# Patient Record
Sex: Female | Born: 1985
Health system: Southern US, Community
[De-identification: ages and names within clinical notes are randomized; demographics above are authoritative.]

## PROBLEM LIST (undated history)

## (undated) DIAGNOSIS — F329 Major depressive disorder, single episode, unspecified: Secondary | ICD-10-CM

## (undated) DIAGNOSIS — G43909 Migraine, unspecified, not intractable, without status migrainosus: Secondary | ICD-10-CM

## (undated) DIAGNOSIS — N83209 Unspecified ovarian cyst, unspecified side: Secondary | ICD-10-CM

## (undated) DIAGNOSIS — F32A Depression, unspecified: Secondary | ICD-10-CM

## (undated) DIAGNOSIS — O00109 Unspecified tubal pregnancy without intrauterine pregnancy: Secondary | ICD-10-CM

## (undated) DIAGNOSIS — I10 Essential (primary) hypertension: Secondary | ICD-10-CM

## (undated) DIAGNOSIS — O009 Unspecified ectopic pregnancy without intrauterine pregnancy: Secondary | ICD-10-CM

## (undated) DIAGNOSIS — E282 Polycystic ovarian syndrome: Secondary | ICD-10-CM

## (undated) HISTORY — DX: Migraine, unspecified, not intractable, without status migrainosus: G43.909

## (undated) HISTORY — DX: Depression, unspecified: F32.A

## (undated) HISTORY — DX: Unspecified tubal pregnancy without intrauterine pregnancy: O00.109

## (undated) HISTORY — DX: Major depressive disorder, single episode, unspecified: F32.9

## (undated) HISTORY — PX: WISDOM TOOTH EXTRACTION: SHX21

---
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
## 2005-03-03 ENCOUNTER — Other Ambulatory Visit: Admission: RE | Admit: 2005-03-03 | Discharge: 2005-03-03 | Payer: Self-pay | Admitting: Obstetrics and Gynecology

## 2005-09-24 ENCOUNTER — Emergency Department (HOSPITAL_COMMUNITY): Admission: EM | Admit: 2005-09-24 | Discharge: 2005-09-24 | Payer: Self-pay | Admitting: Emergency Medicine

## 2005-10-08 ENCOUNTER — Emergency Department (HOSPITAL_COMMUNITY): Admission: EM | Admit: 2005-10-08 | Discharge: 2005-10-09 | Payer: Self-pay | Admitting: Emergency Medicine

## 2005-10-10 ENCOUNTER — Emergency Department (HOSPITAL_COMMUNITY): Admission: EM | Admit: 2005-10-10 | Discharge: 2005-10-10 | Payer: Self-pay | Admitting: Emergency Medicine

## 2006-02-13 ENCOUNTER — Emergency Department (HOSPITAL_COMMUNITY): Admission: EM | Admit: 2006-02-13 | Discharge: 2006-02-13 | Payer: Self-pay | Admitting: Emergency Medicine

## 2006-06-04 ENCOUNTER — Emergency Department (HOSPITAL_COMMUNITY): Admission: EM | Admit: 2006-06-04 | Discharge: 2006-06-05 | Payer: Self-pay | Admitting: Emergency Medicine

## 2006-06-05 ENCOUNTER — Emergency Department (HOSPITAL_COMMUNITY): Admission: EM | Admit: 2006-06-05 | Discharge: 2006-06-05 | Payer: Self-pay | Admitting: Emergency Medicine

## 2010-07-28 ENCOUNTER — Encounter: Payer: BC Managed Care – PPO | Attending: Certified Nurse Midwife | Admitting: Dietician

## 2010-07-28 DIAGNOSIS — T7840XA Allergy, unspecified, initial encounter: Secondary | ICD-10-CM | POA: Insufficient documentation

## 2010-07-28 DIAGNOSIS — E282 Polycystic ovarian syndrome: Secondary | ICD-10-CM | POA: Insufficient documentation

## 2010-07-28 DIAGNOSIS — Z713 Dietary counseling and surveillance: Secondary | ICD-10-CM | POA: Insufficient documentation

## 2010-07-28 DIAGNOSIS — N979 Female infertility, unspecified: Secondary | ICD-10-CM | POA: Insufficient documentation

## 2010-07-28 DIAGNOSIS — I10 Essential (primary) hypertension: Secondary | ICD-10-CM | POA: Insufficient documentation

## 2010-10-31 ENCOUNTER — Inpatient Hospital Stay (HOSPITAL_COMMUNITY)
Admission: AD | Admit: 2010-10-31 | Discharge: 2010-10-31 | Disposition: A | Discharge status: Home / Self Care | Payer: BC Managed Care – PPO | Source: Ambulatory Visit | Attending: Obstetrics & Gynecology | Admitting: Obstetrics & Gynecology

## 2010-10-31 ENCOUNTER — Other Ambulatory Visit: Payer: Self-pay

## 2010-10-31 ENCOUNTER — Encounter (HOSPITAL_COMMUNITY): Payer: Self-pay

## 2010-10-31 DIAGNOSIS — O009 Unspecified ectopic pregnancy without intrauterine pregnancy: Secondary | ICD-10-CM | POA: Insufficient documentation

## 2010-10-31 DIAGNOSIS — O00109 Unspecified tubal pregnancy without intrauterine pregnancy: Secondary | ICD-10-CM

## 2010-10-31 HISTORY — DX: Unspecified ovarian cyst, unspecified side: N83.209

## 2010-10-31 HISTORY — DX: Polycystic ovarian syndrome: E28.2

## 2010-10-31 HISTORY — DX: Unspecified tubal pregnancy without intrauterine pregnancy: O00.109

## 2010-10-31 LAB — TYPE AND SCREEN: Antibody Screen: NEGATIVE

## 2010-10-31 LAB — BUN: BUN: 10 mg/dL (ref 6–23)

## 2010-10-31 LAB — DIFFERENTIAL
Basophils Absolute: 0 10*3/uL (ref 0.0–0.1)
Basophils Relative: 0 % (ref 0–1)
Lymphocytes Relative: 25 % (ref 12–46)
Monocytes Absolute: 0.5 10*3/uL (ref 0.1–1.0)
Neutro Abs: 6.8 10*3/uL (ref 1.7–7.7)
Neutrophils Relative %: 69 % (ref 43–77)

## 2010-10-31 LAB — CBC
HCT: 35.3 % — ABNORMAL LOW (ref 36.0–46.0)
Hemoglobin: 12.1 g/dL (ref 12.0–15.0)
RDW: 13 % (ref 11.5–15.5)
WBC: 9.9 10*3/uL (ref 4.0–10.5)

## 2010-10-31 LAB — CREATININE, SERUM
Creatinine, Ser: 0.66 mg/dL (ref 0.50–1.10)
GFR calc non Af Amer: >60 mL/min (ref >60)

## 2010-10-31 LAB — HCG, QUANTITATIVE, PREGNANCY: hCG, Beta Chain, Quant, S: 2361 m[IU]/mL — ABNORMAL HIGH (ref <5)

## 2010-10-31 MED ORDER — METHOTREXATE INJECTION FOR WOMEN'S HOSPITAL
50.0000 mg/m2 | Freq: Once | INTRAMUSCULAR | Status: AC
  Administered 2010-10-31: 80 mg via INTRAMUSCULAR
  Filled 2010-10-31: qty 1.6

## 2010-10-31 NOTE — Progress Notes (Signed)
Sent from office, bleeding started 07/21.  Dx today with ectopic preg on left side.

## 2010-10-31 NOTE — ED Provider Notes (Signed)
History     Chief Complaint  Patient presents with  . Ectopic Pregnancy   HPI Comments: Patient seen in office today for vaginal spotting since Saturday, rpt quant done, slow rise over last few days (beta was 900+ on 7/27, today 1558).  LMP 09/16/2010, conceived on Femara. Sono in office today shows NO gestational sac in uterus, and nl ovaries. Left adnexal paratubal echogenic mass  measuring 1.0 x 1.0 x 0.9 cm with increased vascularity noted. Consistent with ectopic pregnancy given slow rise in beta and now spotting. Patient was counseled re medical vs surgical management at length. Given presentation at this time and desire to preserve fertility, best option for methotrexate advised and patient consented. Dr. Seymour Bars consulted and agrees.  Vaginal Bleeding She is pregnant.    OB History    Grav Para Term Preterm Abortions TAB SAB Ect Mult Living   1               Past Medical History  Diagnosis Date  . Ovarian cyst   . PCOS (polycystic ovarian syndrome)     Past Surgical History  Procedure Date  . No past surgeries     No family history on file.  History  Substance Use Topics  . Smoking status: Never Smoker   . Smokeless tobacco: Never Used  . Alcohol Use: No    Allergies: No Known Allergies  No prescriptions prior to admission    Review of Systems  Genitourinary: Positive for vaginal bleeding.  All other systems reviewed and are negative.  Denies abdominal pain, cramping, diaphoresis, dizziness, SOB Physical Exam   Labs: WBC  Date Value Range Status  10/31/2010 9.9  4.0-10.5 (K/uL) Final     Hemoglobin  Date Value Range Status  10/31/2010 12.1  12.0-15.0 (g/dL) Final     HCT  Date Value Range Status  10/31/2010 35.3* 36.0-46.0 (%) Final     Platelets  Date Value Range Status  10/31/2010 181  150-400 (K/uL) Final     AST  Date Value Range Status  10/31/2010 13  0-37 (U/L) Final     ABO/RH(D)  Date Value Range Status  10/31/2010 O POS   Final     Serum creatinine 0.66 ; BUN 10 BhCG 2361  Blood pressure 120/74, pulse 93, temperature 98.8 F (37.1 C), temperature source Oral, resp. rate 18, height 5' 1.5" (1.562 m), weight 59.512 kg (131 lb 3.2 oz), last menstrual period 09/16/2010.  Physical Exam  Vitals reviewed. Constitutional: She is oriented to person, place, and time. She appears well-developed and well-nourished.  HENT:  Head: Normocephalic.  Neck: Normal range of motion.  Cardiovascular: Normal rate and regular rhythm.   Respiratory: Effort normal and breath sounds normal.  GI: Soft. Bowel sounds are normal. There is no tenderness.  Genitourinary: Uterus normal.  Neurological: She is alert and oriented to person, place, and time.  Skin: Skin is warm and dry.  Psychiatric: She has a normal mood and affect.  Appropriately sad  MAU Course  Procedures  MDM n/a  Assessment and Plan  Ectopic pregnancy - L tubal - at 6 3/7 wks Stable hemodynamic status Normal renal and liver function tests RH positive status  Methotrexate single dose protocol Plan repeat HCG levels day 4 and day 7 in MAU (results STAT to CNM on call) F/U with Office visit in 2 weeks Ectopic precautions reviewed and H/O given  Dr. Seymour Bars consulted - agrees.  April Bowen,April Bowen 10/31/2010, 8:10 PM

## 2010-10-31 NOTE — ED Notes (Signed)
Ectopic packet given to pt.  Reviewed contents and plan.

## 2010-11-03 ENCOUNTER — Inpatient Hospital Stay (HOSPITAL_COMMUNITY)
Admission: AD | Admit: 2010-11-03 | Discharge: 2010-11-03 | Disposition: A | Discharge status: Home / Self Care | Payer: BC Managed Care – PPO | Source: Ambulatory Visit | Attending: Obstetrics | Admitting: Obstetrics

## 2010-11-03 DIAGNOSIS — Z0189 Encounter for other specified special examinations: Secondary | ICD-10-CM | POA: Insufficient documentation

## 2010-11-03 LAB — HCG, QUANTITATIVE, PREGNANCY: hCG, Beta Chain, Quant, S: 3594 m[IU]/mL — ABNORMAL HIGH (ref <5)

## 2010-11-03 NOTE — Progress Notes (Signed)
Will all with results

## 2010-11-03 NOTE — ED Provider Notes (Addendum)
April Bowen is a 25 y.o. female who return for follow up Bhcg s/p MTX. Her bhcg on 7/30 was 2,361 and she received MTX. Today the Bhcg is 3,594. The patient is stable and the RN will call the results to Dr. Ernestina Penna.    [place], NP 11/03/10 [address] [place], [place] 11/08/10 [phone]

## 2010-11-06 ENCOUNTER — Inpatient Hospital Stay (HOSPITAL_COMMUNITY)
Admission: AD | Admit: 2010-11-06 | Discharge: 2010-11-06 | Disposition: A | Discharge status: Home / Self Care | Payer: BC Managed Care – PPO | Source: Ambulatory Visit | Attending: Obstetrics & Gynecology | Admitting: Obstetrics & Gynecology

## 2010-11-06 DIAGNOSIS — O00109 Unspecified tubal pregnancy without intrauterine pregnancy: Secondary | ICD-10-CM | POA: Insufficient documentation

## 2010-11-06 LAB — CREATININE, SERUM
Creatinine, Ser: 0.56 mg/dL (ref 0.50–1.10)
GFR calc Af Amer: >60 mL/min (ref >60)
GFR calc non Af Amer: >60 mL/min (ref >60)

## 2010-11-06 LAB — BUN: BUN: 8 mg/dL (ref 6–23)

## 2010-11-06 LAB — DIFFERENTIAL
Basophils Relative: 0 % (ref 0–1)
Lymphocytes Relative: 32 % (ref 12–46)
Lymphs Abs: 2.3 10*3/uL (ref 0.7–4.0)
Monocytes Absolute: 0.3 10*3/uL (ref 0.1–1.0)
Monocytes Relative: 4 % (ref 3–12)
Neutro Abs: 4.6 10*3/uL (ref 1.7–7.7)
Neutrophils Relative %: 63 % (ref 43–77)

## 2010-11-06 LAB — CBC
HCT: 33.3 % — ABNORMAL LOW (ref 36.0–46.0)
Hemoglobin: 11.4 g/dL — ABNORMAL LOW (ref 12.0–15.0)
MCHC: 34.2 g/dL (ref 30.0–36.0)
RBC: 3.63 MIL/uL — ABNORMAL LOW (ref 3.87–5.11)
WBC: 7.3 10*3/uL (ref 4.0–10.5)

## 2010-11-06 LAB — HCG, QUANTITATIVE, PREGNANCY: hCG, Beta Chain, Quant, S: 3662 m[IU]/mL — ABNORMAL HIGH (ref <5)

## 2010-11-06 MED ORDER — METHOTREXATE INJECTION FOR WOMEN'S HOSPITAL
50.0000 mg/m2 | Freq: Once | INTRAMUSCULAR | Status: AC
  Administered 2010-11-06: 80 mg via INTRAMUSCULAR
  Filled 2010-11-06: qty 1.6

## 2010-11-06 MED ORDER — METHOTREXATE SODIUM CHEMO INJECTION 25 MG/ML PF: 50.0000 mg/m2 | Freq: Once | INTRAMUSCULAR | Status: DC

## 2010-11-06 NOTE — Progress Notes (Addendum)
Received Methotrexate 1 week ago for ectopic, passed 2 clots lighter than her usual bleeding, cramping. Seen for labs. I was not contacted with her.

## 2010-11-09 ENCOUNTER — Inpatient Hospital Stay (HOSPITAL_COMMUNITY)
Admission: AD | Admit: 2010-11-09 | Discharge: 2010-11-09 | Disposition: A | Discharge status: Home / Self Care | Payer: BC Managed Care – PPO | Source: Ambulatory Visit | Attending: Obstetrics | Admitting: Obstetrics

## 2010-11-09 DIAGNOSIS — N938 Other specified abnormal uterine and vaginal bleeding: Secondary | ICD-10-CM | POA: Insufficient documentation

## 2010-11-09 DIAGNOSIS — N949 Unspecified condition associated with female genital organs and menstrual cycle: Secondary | ICD-10-CM | POA: Insufficient documentation

## 2010-11-09 LAB — HCG, QUANTITATIVE, PREGNANCY: hCG, Beta Chain, Quant, S: 2582 m[IU]/mL — ABNORMAL HIGH (ref <5)

## 2010-11-09 NOTE — Progress Notes (Signed)
Patient reports bleeding like a period, had cramping last night.

## 2010-11-14 ENCOUNTER — Inpatient Hospital Stay (HOSPITAL_COMMUNITY)
Admission: AD | Admit: 2010-11-14 | Discharge: 2010-11-14 | Disposition: A | Discharge status: Home / Self Care | Payer: BC Managed Care – PPO | Source: Ambulatory Visit | Attending: Obstetrics and Gynecology | Admitting: Obstetrics and Gynecology

## 2010-11-14 DIAGNOSIS — O00109 Unspecified tubal pregnancy without intrauterine pregnancy: Secondary | ICD-10-CM | POA: Insufficient documentation

## 2010-11-14 LAB — HCG, QUANTITATIVE, PREGNANCY: hCG, Beta Chain, Quant, S: 1261 m[IU]/mL — ABNORMAL HIGH (ref <5)

## 2010-11-14 NOTE — Progress Notes (Signed)
Pt to MAU for repeat BHCG day 7 after 2nd MTX injection for ectopic pregnancy. Pt states she is having no pain or bleeding.

## 2010-11-14 NOTE — ED Notes (Signed)
Pt. Will continue F/U labs in office. Has appointment  tomorrow with Wiliam Ke, CNM.  Results called to T. Bailey cell phone.

## 2011-09-05 ENCOUNTER — Encounter (HOSPITAL_COMMUNITY): Payer: Self-pay | Admitting: *Deleted

## 2011-09-05 ENCOUNTER — Inpatient Hospital Stay (HOSPITAL_COMMUNITY): Payer: BC Managed Care – PPO

## 2011-09-05 ENCOUNTER — Inpatient Hospital Stay (HOSPITAL_COMMUNITY)
Admission: AD | Admit: 2011-09-05 | Discharge: 2011-09-05 | Disposition: A | Discharge status: Home / Self Care | Payer: BC Managed Care – PPO | Source: Ambulatory Visit | Attending: Obstetrics & Gynecology | Admitting: Obstetrics & Gynecology

## 2011-09-05 DIAGNOSIS — N949 Unspecified condition associated with female genital organs and menstrual cycle: Secondary | ICD-10-CM | POA: Insufficient documentation

## 2011-09-05 DIAGNOSIS — B9689 Other specified bacterial agents as the cause of diseases classified elsewhere: Secondary | ICD-10-CM | POA: Insufficient documentation

## 2011-09-05 DIAGNOSIS — R109 Unspecified abdominal pain: Secondary | ICD-10-CM | POA: Insufficient documentation

## 2011-09-05 DIAGNOSIS — N760 Acute vaginitis: Secondary | ICD-10-CM | POA: Insufficient documentation

## 2011-09-05 DIAGNOSIS — A499 Bacterial infection, unspecified: Secondary | ICD-10-CM | POA: Insufficient documentation

## 2011-09-05 HISTORY — DX: Unspecified ectopic pregnancy without intrauterine pregnancy: O00.90

## 2011-09-05 LAB — URINALYSIS, ROUTINE W REFLEX MICROSCOPIC
Bilirubin Urine: NEGATIVE
Glucose, UA: NEGATIVE mg/dL
Hgb urine dipstick: NEGATIVE
Ketones, ur: NEGATIVE mg/dL
Leukocytes, UA: NEGATIVE
Nitrite: NEGATIVE
Protein, ur: NEGATIVE mg/dL
Specific Gravity, Urine: 1.02 (ref 1.005–1.030)
Urobilinogen, UA: 0.2 mg/dL (ref 0.0–1.0)
pH: 7 (ref 5.0–8.0)

## 2011-09-05 LAB — WET PREP, GENITAL: Trich, Wet Prep: NONE SEEN

## 2011-09-05 MED ORDER — FLUCONAZOLE 150 MG PO TABS: 150.0000 mg | ORAL_TABLET | Freq: Once | ORAL | Status: DC

## 2011-09-05 MED ORDER — FLUCONAZOLE 150 MG PO TABS: 150.0000 mg | ORAL_TABLET | Freq: Once | ORAL | Status: AC

## 2011-09-05 MED ORDER — METRONIDAZOLE 0.75 % EX GEL: Freq: Every day | CUTANEOUS | Status: DC

## 2011-09-05 MED ORDER — METRONIDAZOLE 0.75 % EX GEL
Freq: Every day | CUTANEOUS | Status: DC
  Filled 2011-09-05: qty 45

## 2011-09-05 NOTE — MAU Note (Signed)
Pt states she started feeling pain in left side pt feels that it is her ovary due to history of cysts. Pt states pain moved from back to front today left side

## 2011-09-05 NOTE — Discharge Instructions (Signed)
Bacterial Vaginosis Bacterial vaginosis (BV) is a vaginal infection where the normal balance of bacteria in the vagina is disrupted. The normal balance is then replaced by an overgrowth of certain bacteria. There are several different kinds of bacteria that can cause BV. BV is the most common vaginal infection in women of childbearing age. CAUSES   The cause of BV is not fully understood. BV develops when there is an increase or imbalance of harmful bacteria.   Some activities or behaviors can upset the normal balance of bacteria in the vagina and put women at increased risk including:   Having a new sex partner or multiple sex partners.   Douching.   Using an intrauterine device (IUD) for contraception.   It is not clear what role sexual activity plays in the development of BV. However, women that have never had sexual intercourse are rarely infected with BV.  Women do not get BV from toilet seats, bedding, swimming pools or from touching objects around them.  SYMPTOMS   Grey vaginal discharge.   A fish-like odor with discharge, especially after sexual intercourse.   Itching or burning of the vagina and vulva.   Burning or pain with urination.   Some women have no signs or symptoms at all.  DIAGNOSIS  Your caregiver must examine the vagina for signs of BV. Your caregiver will perform lab tests and look at the sample of vaginal fluid through a microscope. They will look for bacteria and abnormal cells (clue cells), a pH test higher than 4.5, and a positive amine test all associated with BV.  RISKS AND COMPLICATIONS   Pelvic inflammatory disease (PID).   Infections following gynecology surgery.   Developing HIV.   Developing herpes virus.  TREATMENT  Sometimes BV will clear up without treatment. However, all women with symptoms of BV should be treated to avoid complications, especially if gynecology surgery is planned. Female partners generally do not need to be treated. However,  BV may spread between female sex partners so treatment is helpful in preventing a recurrence of BV.   BV may be treated with antibiotics. The antibiotics come in either pill or vaginal cream forms. Either can be used with nonpregnant or pregnant women, but the recommended dosages differ. These antibiotics are not harmful to the baby.   BV can recur after treatment. If this happens, a second round of antibiotics will often be prescribed.   Treatment is important for pregnant women. If not treated, BV can cause a premature delivery, especially for a pregnant woman who had a premature birth in the past. All pregnant women who have symptoms of BV should be checked and treated.   For chronic reoccurrence of BV, treatment with a type of prescribed gel vaginally twice a week is helpful.  HOME CARE INSTRUCTIONS   Finish all medication as directed by your caregiver.   Do not have sex until treatment is completed.   Tell your sexual partner that you have a vaginal infection. They should see their caregiver and be treated if they have problems, such as a mild rash or itching.   Practice safe sex. Use condoms. Only have 1 sex partner.  PREVENTION  Basic prevention steps can help reduce the risk of upsetting the natural balance of bacteria in the vagina and developing BV:  Do not have sexual intercourse (be abstinent).   Do not douche.   Use all of the medicine prescribed for treatment of BV, even if the signs and symptoms go away.     Tell your sex partner if you have BV. That way, they can be treated, if needed, to prevent reoccurrence.  SEEK MEDICAL CARE IF:   Your symptoms are not improving after 3 days of treatment.   You have increased discharge, pain, or fever.  MAKE SURE YOU:   Understand these instructions.   Will watch your condition.   Will get help right away if you are not doing well or get worse.  FOR MORE INFORMATION  Division of STD Prevention (DSTDP), Centers for Disease  Control and Prevention: www.cdc.gov/std American Social Health Association (ASHA): www.ashastd.org  Document Released: 03/20/2005 Document Revised: 03/09/2011 Document Reviewed: 09/10/2008 Exit[employer] Patient Information 2012 [employer]. 

## 2011-09-05 NOTE — ED Provider Notes (Signed)
History  April Bowen 26 y.o. G1P0010  H/O ectopic pregnancy      Chief Complaint  Patient presents with  . Abdominal Pain   HPI:  Pelvic pain intermittently x 3 days.  No shoulder pain.  Menses q1-2 mths normal flow.  LMP 08/03/2011.  ROS neg.  OB History    Grav Para Term Preterm Abortions TAB SAB Ect Mult Living   1    1   1         Past Medical History  Diagnosis Date  . Ovarian cyst   . PCOS (polycystic ovarian syndrome)   . No pertinent past medical history   . Ectopic pregnancy     Past Surgical History  Procedure Date  . Wisdom tooth extraction     Family History  Problem Relation Age of Onset  . Hypertension Mother   . Hypertension Father     History  Substance Use Topics  . Smoking status: Never Smoker   . Smokeless tobacco: Never Used  . Alcohol Use: No    Allergies: No Known Allergies  Prescriptions prior to admission  Medication Sig Dispense Refill  . prenatal vitamin w/FE, FA (PRENATAL 1 + 1) 27-1 MG TABS Take 1 tablet by mouth daily.        . progesterone (PROMETRIUM) 200 MG capsule Take 200 mg by mouth at bedtime. Stopped 10/31/10 per MD          Physical Exam   Blood pressure 121/71, pulse 92, temperature 97.9 F (36.6 C), temperature source Oral, resp. rate 18, last menstrual period 08/03/2010, not currently breastfeeding.  Abdo soft, NT Speculum:  Cervix, vagina wnl.  Mild odor with secretions.  Wet prep/Gono/Chlam done  Pelvic US uterus wnl, ovaries wnl x 2.  Small FF. PT neg U/A wnl  ED Course  Stable.  Not in pain.  A/P  Left pelvic pain x about 3 days, on-off.  No pain currently.  PT neg.         Pelvic  US neg, no ovarian cyst.  R/O STI, Gono-Chlam pending.         Wet prep pending, probable BV.         Declines contraception.  Recommended to repeat UPT if no menses soon.  F/U [facility] if pain recurs.  Genia Del MD  09/05/2011 at 8:11 pm

## 2011-09-06 LAB — GC/CHLAMYDIA PROBE AMP, GENITAL
Chlamydia, DNA Probe: NEGATIVE
GC Probe Amp, Genital: NEGATIVE

## 2011-11-16 ENCOUNTER — Emergency Department (HOSPITAL_COMMUNITY): Payer: Self-pay

## 2011-11-16 ENCOUNTER — Encounter (HOSPITAL_COMMUNITY): Payer: Self-pay | Admitting: *Deleted

## 2011-11-16 ENCOUNTER — Emergency Department (HOSPITAL_COMMUNITY)
Admission: EM | Admit: 2011-11-16 | Discharge: 2011-11-16 | Disposition: A | Discharge status: Home / Self Care | Payer: Self-pay | Attending: Emergency Medicine | Admitting: Emergency Medicine

## 2011-11-16 DIAGNOSIS — R319 Hematuria, unspecified: Secondary | ICD-10-CM

## 2011-11-16 DIAGNOSIS — R109 Unspecified abdominal pain: Secondary | ICD-10-CM | POA: Insufficient documentation

## 2011-11-16 DIAGNOSIS — M549 Dorsalgia, unspecified: Secondary | ICD-10-CM | POA: Insufficient documentation

## 2011-11-16 LAB — URINALYSIS, DIPSTICK ONLY
Glucose, UA: NEGATIVE mg/dL
Protein, ur: NEGATIVE mg/dL
pH: 6.5 (ref 5.0–8.0)

## 2011-11-16 LAB — PREGNANCY, URINE: Preg Test, Ur: NEGATIVE

## 2011-11-16 MED ORDER — CIPROFLOXACIN HCL 500 MG PO TABS: 500.0000 mg | ORAL_TABLET | Freq: Two times a day (BID) | ORAL | Status: AC

## 2011-11-16 NOTE — ED Notes (Signed)
Pt reports abd pain, back pain and blood in urine.  Pt reports having a urinary tract infection in which she took over the counter AZO.  Pt reports worsening symptoms.  Pt denies n/v or chills

## 2011-11-16 NOTE — ED Provider Notes (Signed)
History     CSN: [id number]  Arrival date & time 11/16/11  1745   First MD Initiated Contact with Patient 11/16/11 1848      Chief Complaint  Patient presents with  . Abdominal Pain  . Back Pain  . Hematuria    (Consider location/radiation/quality/duration/timing/severity/associated sxs/prior treatment) Patient is a 26 y.o. female presenting with abdominal pain, back pain, and hematuria. The history is provided by the patient.  Abdominal Pain The primary symptoms of the illness include abdominal pain.  Additional symptoms associated with the illness include hematuria and back pain.  Back Pain  Associated symptoms include abdominal pain.  Hematuria Associated symptoms include abdominal pain.   patient here with left-sided colicky flank pain x1 week. She has had hematuria without fever or vomiting. No vaginal bleeding or discharge. No prior history of kidney stones. Has used over-the-counter medications thinking that she had urinary tract infection which has not helped. Nothing makes her symptoms better or worse.  Past Medical History  Diagnosis Date  . Ovarian cyst   . PCOS (polycystic ovarian syndrome)   . No pertinent past medical history   . Ectopic pregnancy     Past Surgical History  Procedure Date  . Wisdom tooth extraction     Family History  Problem Relation Age of Onset  . Hypertension Mother   . Hypertension Father     History  Substance Use Topics  . Smoking status: Never Smoker   . Smokeless tobacco: Never Used  . Alcohol Use: No    OB History    Grav Para Term Preterm Abortions TAB SAB Ect Mult Living   1    1   1         Review of Systems  Gastrointestinal: Positive for abdominal pain.  Genitourinary: Positive for hematuria.  Musculoskeletal: Positive for back pain.  All other systems reviewed and are negative.    Allergies  Review of patient's allergies indicates no known allergies.  Home Medications   Current Outpatient Rx  Name  Route Sig Dispense Refill  . PRENATAL MULTIVITAMIN CH Oral Take 1 tablet by mouth daily.      BP 134/80  Pulse 89  Temp 98.4 F (36.9 C) (Oral)  Resp 14  SpO2 100%  Physical Exam  Nursing note and vitals reviewed. Constitutional: She is oriented to person, place, and time. She appears well-developed and well-nourished.  Non-toxic appearance. No distress.  HENT:  Head: Normocephalic and atraumatic.  Eyes: Conjunctivae, EOM and lids are normal. Pupils are equal, round, and reactive to light.  Neck: Normal range of motion. Neck supple. No tracheal deviation present. No mass present.  Cardiovascular: Normal rate, regular rhythm and normal heart sounds.  Exam reveals no gallop.   No murmur heard. Pulmonary/Chest: Effort normal and breath sounds normal. No stridor. No respiratory distress. She has no decreased breath sounds. She has no wheezes. She has no rhonchi. She has no rales.  Abdominal: Soft. Normal appearance and bowel sounds are normal. She exhibits no distension. There is no tenderness. There is CVA tenderness. There is no rebound.  Musculoskeletal: Normal range of motion. She exhibits no edema and no tenderness.  Neurological: She is alert and oriented to person, place, and time. She has normal strength. No cranial nerve deficit or sensory deficit. GCS eye subscore is 4. GCS verbal subscore is 5. GCS motor subscore is 6.  Skin: Skin is warm and dry. No abrasion and no rash noted.  Psychiatric: She has a normal mood  and affect. Her speech is normal and behavior is normal.    ED Course  Procedures (including critical care time)  Labs Reviewed  URINALYSIS, DIPSTICK ONLY - Abnormal; Notable for the following:    Hgb urine dipstick MODERATE (*)     Leukocytes, UA LARGE (*)     All other components within normal limits  PREGNANCY, URINE   No results found.   No diagnosis found.    MDM  pts ct with intra-renal stones, no urertal stones--urine cx sent, will start  abx        Toy Baker, MD 11/16/11 2046

## 2012-07-09 ENCOUNTER — Emergency Department (HOSPITAL_COMMUNITY)
Admission: EM | Admit: 2012-07-09 | Discharge: 2012-07-09 | Disposition: A | Discharge status: Home / Self Care | Payer: 59 | Attending: Emergency Medicine | Admitting: Emergency Medicine

## 2012-07-09 DIAGNOSIS — Z862 Personal history of diseases of the blood and blood-forming organs and certain disorders involving the immune mechanism: Secondary | ICD-10-CM | POA: Insufficient documentation

## 2012-07-09 DIAGNOSIS — L0291 Cutaneous abscess, unspecified: Secondary | ICD-10-CM

## 2012-07-09 DIAGNOSIS — L02419 Cutaneous abscess of limb, unspecified: Secondary | ICD-10-CM | POA: Insufficient documentation

## 2012-07-09 DIAGNOSIS — A4902 Methicillin resistant Staphylococcus aureus infection, unspecified site: Secondary | ICD-10-CM | POA: Insufficient documentation

## 2012-07-09 DIAGNOSIS — Z8639 Personal history of other endocrine, nutritional and metabolic disease: Secondary | ICD-10-CM | POA: Insufficient documentation

## 2012-07-09 DIAGNOSIS — R51 Headache: Secondary | ICD-10-CM | POA: Insufficient documentation

## 2012-07-09 DIAGNOSIS — Z8742 Personal history of other diseases of the female genital tract: Secondary | ICD-10-CM | POA: Insufficient documentation

## 2012-07-09 DIAGNOSIS — R6883 Chills (without fever): Secondary | ICD-10-CM | POA: Insufficient documentation

## 2012-07-09 MED ORDER — OXYCODONE-ACETAMINOPHEN 5-325 MG PO TABS
1.0000 | ORAL_TABLET | Freq: Once | ORAL | Status: AC
  Administered 2012-07-09: 1 via ORAL
  Filled 2012-07-09: qty 1

## 2012-07-09 MED ORDER — LIDOCAINE-PRILOCAINE 2.5-2.5 % EX CREA
TOPICAL_CREAM | Freq: Once | CUTANEOUS | Status: AC
  Administered 2012-07-09: 21:00:00 via TOPICAL
  Filled 2012-07-09: qty 5

## 2012-07-09 NOTE — ED Notes (Signed)
Pt was seen 6 days ago for an abcess on her L leg and it was squeezed but not lanced. Pt started taking bactrim only yesterday because she thought it was better. Tested positive for MRSA.

## 2012-07-09 NOTE — ED Provider Notes (Signed)
History    This chart was scribed for non-physician practitioner working with Ward Givens, MD by Sofie Rower, ED Scribe. This patient was seen in room WTR8/WTR8 and the patient's care was started at 8:23PM.  CSN: [id number]  Arrival date & time 07/09/12  1932   First MD Initiated Contact with Patient 07/09/12 2023      Chief Complaint  Patient presents with  . Skin Abcess     (Consider location/radiation/quality/duration/timing/severity/associated sxs/prior treatment) The history is provided by the patient. No language interpreter was used.    April Bowen is a 27 y.o. female , with a hx of skin abscess with positive MRSA evaluation, who presents to the Emergency Department complaining of gradual, progressively worsening, abscess, located at the posterior left lower extremity, onset 13 days ago (06/26/12). The pt reports she is experiencing a painful abscess located at her posterior left lower extremity, originating on March, 26th, 2014. Furthermore, the pt reveals that she has received prior evaluation with regards to her abscess by her PCP, where which the abscess was squeezed, revealing a pink-pus drainage, which she informs, tested MRSA positive. The pt was prescribed bactrim, however, she reports she has only recently begun taking the medication, with her first application being taken yesterday (07/08/12). The pt rates the pain associated with the abscess at 9/10 at present. The pt has not taken any OTC medications to relieve the pain associated with her abscess. Modifying factors include palpitation of the abscess which intensifies its associated pain.   The pt denies any other medical problems at the present point and time.   The pt does not smoke or drink alcohol.        Past Medical History  Diagnosis Date  . Ovarian cyst   . PCOS (polycystic ovarian syndrome)   . No pertinent past medical history   . Ectopic pregnancy     Past Surgical History  Procedure Laterality Date   . Wisdom tooth extraction      Family History  Problem Relation Age of Onset  . Hypertension Mother   . Hypertension Father     History  Substance Use Topics  . Smoking status: Never Smoker   . Smokeless tobacco: Never Used  . Alcohol Use: No    OB History   Grav Para Term Preterm Abortions TAB SAB Ect Mult Living   1    1   1         Review of Systems  Constitutional: Positive for chills.  Respiratory: Negative for shortness of breath.   Cardiovascular: Negative for chest pain.  Gastrointestinal: Negative for nausea, vomiting and abdominal pain.  Skin: Positive for wound. Negative for rash.  Neurological: Positive for headaches.  All other systems reviewed and are negative.    Allergies  Review of patient's allergies indicates no known allergies.  Home Medications   Current Outpatient Rx  Name  Route  Sig  Dispense  Refill  . sulfamethoxazole-trimethoprim (BACTRIM,SEPTRA) 400-80 MG per tablet   Oral   Take 1 tablet by mouth 2 (two) times daily.           BP 129/81  Pulse 98  Temp(Src) 98.5 F (36.9 C) (Oral)  Resp 18  SpO2 100%  LMP 06/21/2012  Physical Exam  Nursing note and vitals reviewed. Constitutional: She is oriented to person, place, and time. She appears well-developed and well-nourished. No distress.  HENT:  Head: Normocephalic and atraumatic.  Eyes: EOM are normal.  Neck: Neck supple. No tracheal  deviation present.  Cardiovascular: Normal rate.   Pulmonary/Chest: Effort normal. No respiratory distress.  Musculoskeletal: Normal range of motion.  Neurological: She is alert and oriented to person, place, and time.  Skin: Skin is warm and dry. Lesion noted.     Left posterior lower extremity: 3 cm indurated abscess Erythematous diameter 4.5 cm. Abscess tender to palpitation.   Psychiatric: She has a normal mood and affect. Her behavior is normal.    ED Course  Procedures (including critical care time)  DIAGNOSTIC STUDIES: Oxygen  Saturation is 100% on room air, normal by my interpretation.    COORDINATION OF CARE:   8:50 PM- Treatment plan concerning incision and drainage of abscess discussed with patient. Pt agrees with treatment.  10:25 PM- Recheck. Incision and drainage of abscess performed by PA student. Treatment plan discussed with patient. Pt agrees with treatment.  INCISION AND DRAINAGE PROCEDURE NOTE: Patient identification was confirmed and verbal consent was obtained. This procedure was performed by Leo [facility] PA student) at 10:26 PM. Site: left posterior upper thigh Sterile procedures observed Anesthetic used (type and amt): EMLA cream and 2% lidocaine w/o epi 10mL Blade size: 11 blade Drainage: copious purulent pus with blood Complexity: Complex Site not packed Site anesthetized, incision made over site, wound drained and explored loculations, rinsed with copious amounts of normal saline, wound packed with sterile gauze, covered with dry, sterile dressing.  Pt tolerated procedure well without complications.  Instructions for care discussed verbally and pt provided with additional written instructions for homecare and f/u.          Labs Reviewed - No data to display No results found.   1. Abscess of skin       MDM  Patient with skin abscess amenable to incision and drainage.  Abscess was not large enough to warrant packing or drain,  wound recheck in 2 days with PCP. Encouraged home warm soaks and flushing.  Mild signs of cellulitis is surrounding skin.  Will d/c to home.  Patient advised to continue taking Bactrim as prescribed by PCP. Advised to keep area covered and cleaned. I&D site care given. Return precautions given. Patient agreeable to plan. Patient is stable at time of discharge       I personally performed the services described in this documentation, which was scribed in my presence. The recorded information has been reviewed and is  accurate.     Jeannetta Ellis, PA-C 07/10/12 0211

## 2012-07-10 NOTE — ED Provider Notes (Signed)
Medical screening examination/treatment/procedure(s) were performed by non-physician practitioner and as supervising physician I was immediately available for consultation/collaboration. Chace Bisch, MD, FACEP   Sulma Ruffino L Jacarie Pate, MD 07/10/12 2307 

## 2012-08-29 ENCOUNTER — Emergency Department: Payer: Self-pay | Admitting: Emergency Medicine

## 2012-08-29 LAB — COMPREHENSIVE METABOLIC PANEL
Albumin: 3.9 g/dL (ref 3.4–5.0)
Alkaline Phosphatase: 60 U/L (ref 50–136)
Anion Gap: 5 — ABNORMAL LOW (ref 7–16)
BUN: 10 mg/dL (ref 7–18)
Bilirubin,Total: 0.5 mg/dL (ref 0.2–1.0)
Co2: 24 mmol/L (ref 21–32)
Creatinine: 0.74 mg/dL (ref 0.60–1.30)
EGFR (African American): >60
Glucose: 85 mg/dL (ref 65–99)
Potassium: 4.1 mmol/L (ref 3.5–5.1)
SGPT (ALT): 15 U/L (ref 12–78)
Sodium: 136 mmol/L (ref 136–145)
Total Protein: 7.8 g/dL (ref 6.4–8.2)

## 2012-08-29 LAB — GC/CHLAMYDIA PROBE AMP

## 2012-08-29 LAB — URINALYSIS, COMPLETE
Glucose,UR: NEGATIVE mg/dL (ref 0–75)
Ketone: NEGATIVE
Nitrite: NEGATIVE
Ph: 5 (ref 4.5–8.0)
Protein: NEGATIVE
RBC,UR: 5 /HPF (ref 0–5)
Specific Gravity: 1.025 (ref 1.003–1.030)
Squamous Epithelial: 7
WBC UR: 4 /HPF (ref 0–5)

## 2012-08-29 LAB — CBC
HCT: 37.8 % (ref 35.0–47.0)
HGB: 13.1 g/dL (ref 12.0–16.0)
MCH: 32.1 pg (ref 26.0–34.0)
MCHC: 34.6 g/dL (ref 32.0–36.0)
MCV: 93 fL (ref 80–100)
RBC: 4.06 10*6/uL (ref 3.80–5.20)
RDW: 13 % (ref 11.5–14.5)
WBC: 10.2 10*3/uL (ref 3.6–11.0)

## 2013-06-02 IMAGING — US US PELVIS COMPLETE
1 series · 14 of 25 positions shown · non-contrast
Comparison: None.

CLINICAL DATA: Left lower quadrant pain.

TRANSABDOMINAL AND TRANSVAGINAL ULTRASOUND OF PELVIS
TECHNIQUE: Both transabdominal and transvaginal ultrasound
examinations of the pelvis were performed. Transabdominal technique
was performed for global imaging of the pelvis including uterus,
ovaries, adnexal regions, and pelvic cul-de-sac.

[Series 1: us pelvis complete · 14 of 47 slices shown]
[im 1/47]
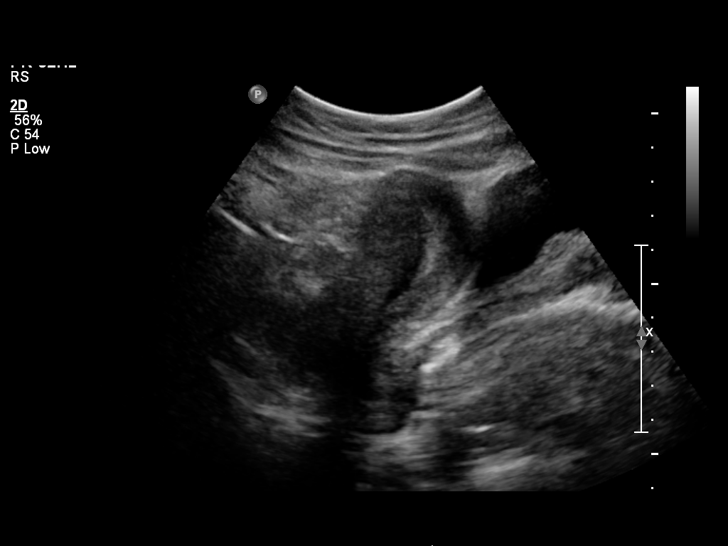
[im 4/47]
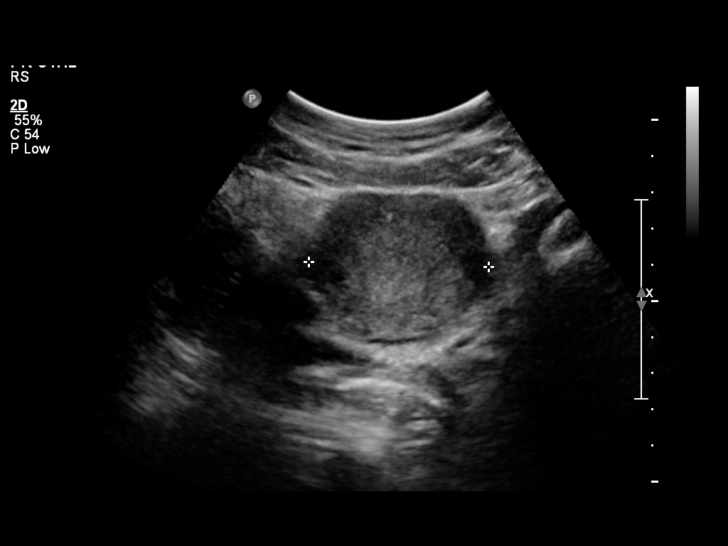
[im 8/47]
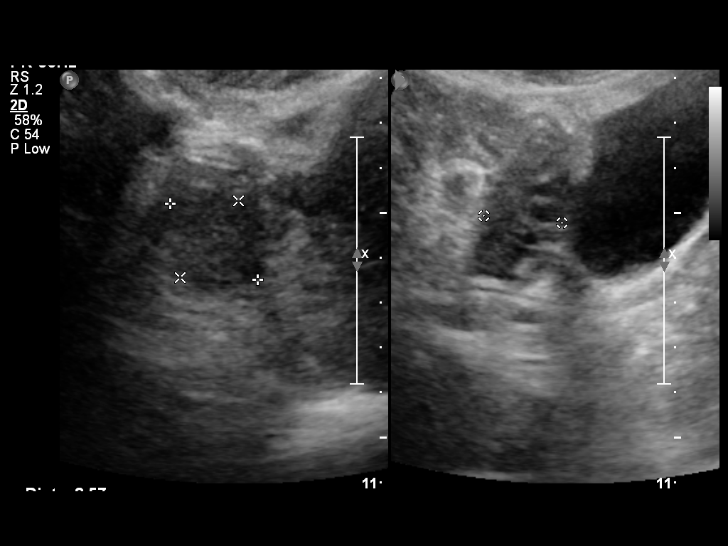
[im 12/47]
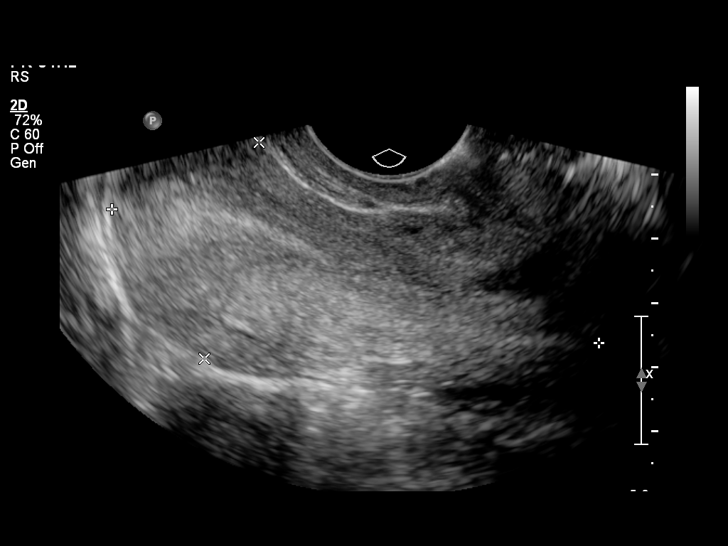
[im 16/47]
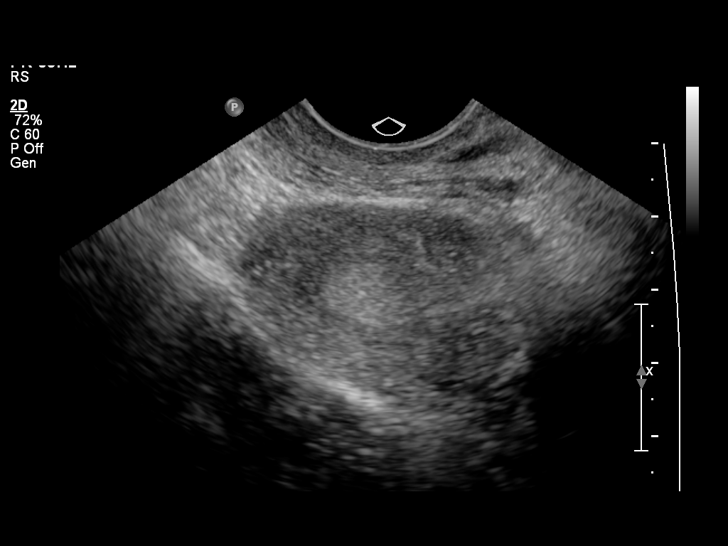
[im 18/47]
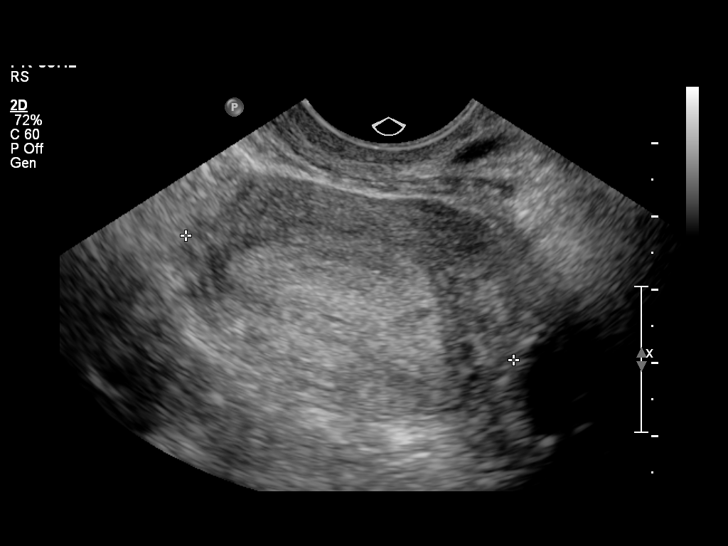
[im 22/47]
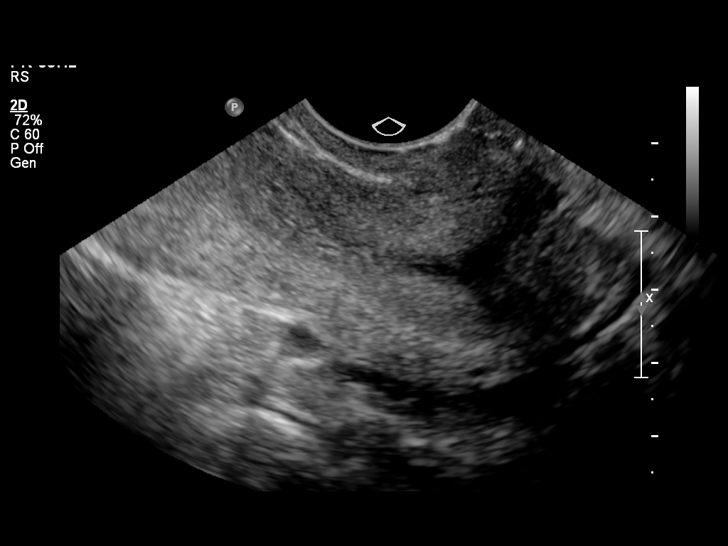
[im 25/47]
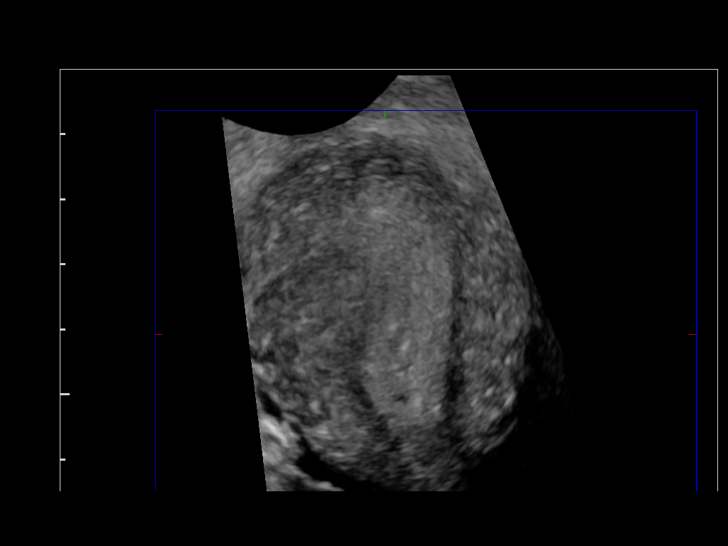
[im 29/47]
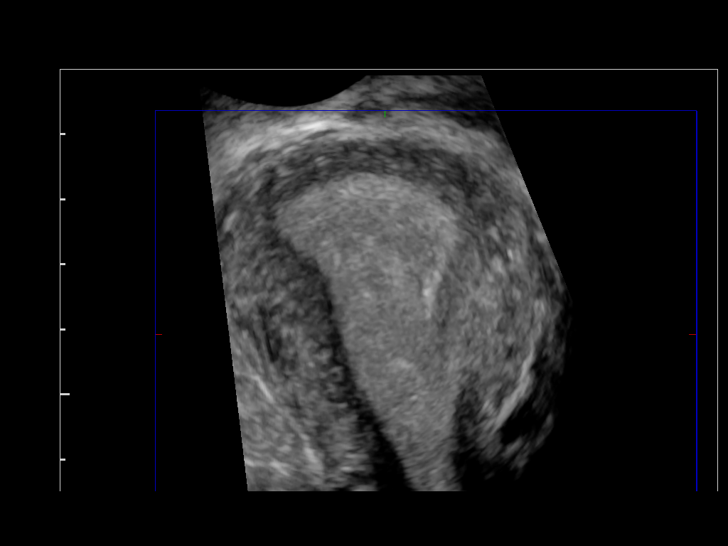
[im 31/47]
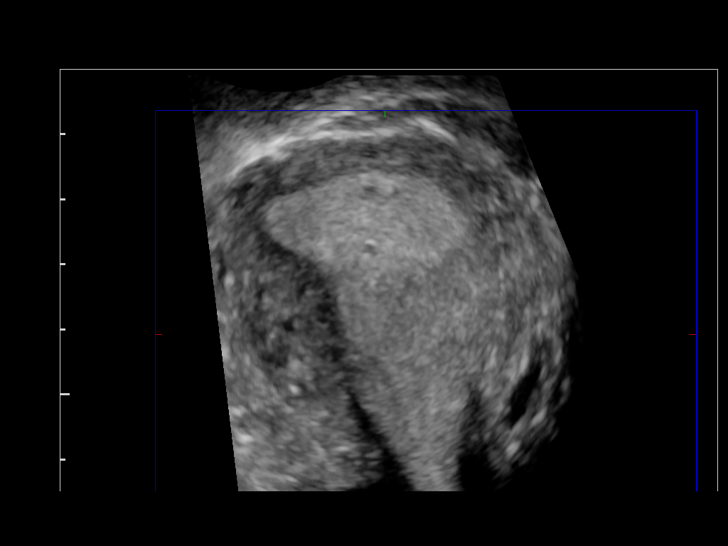
[im 35/47]
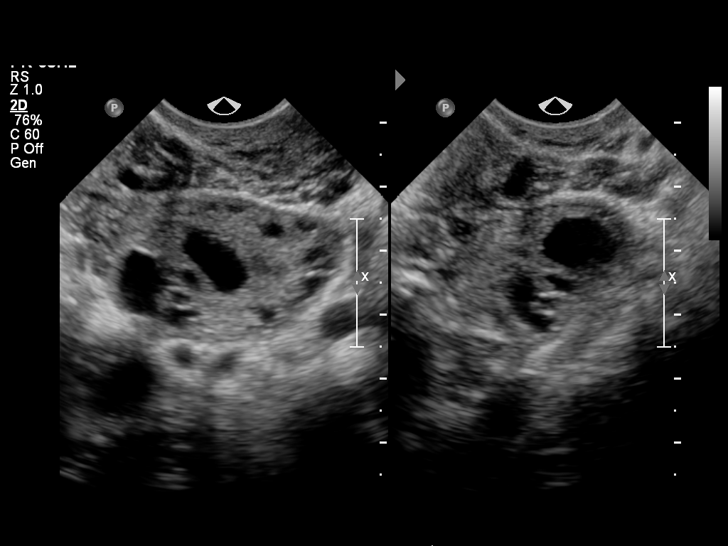
[im 39/47]
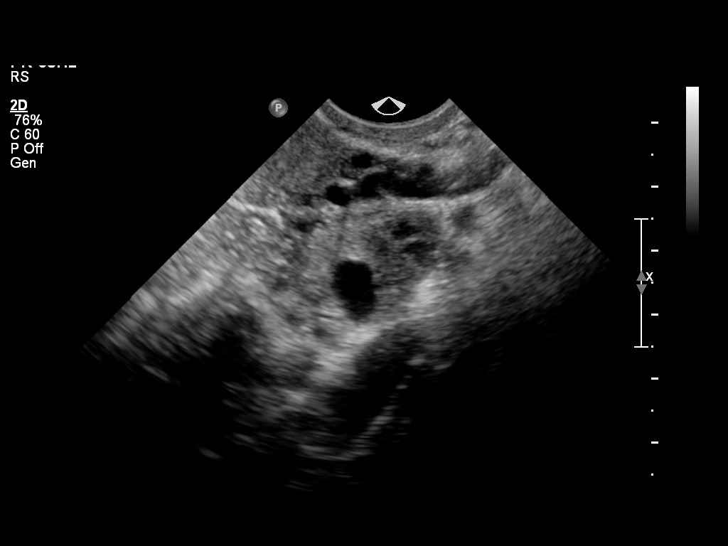
[im 43/47]
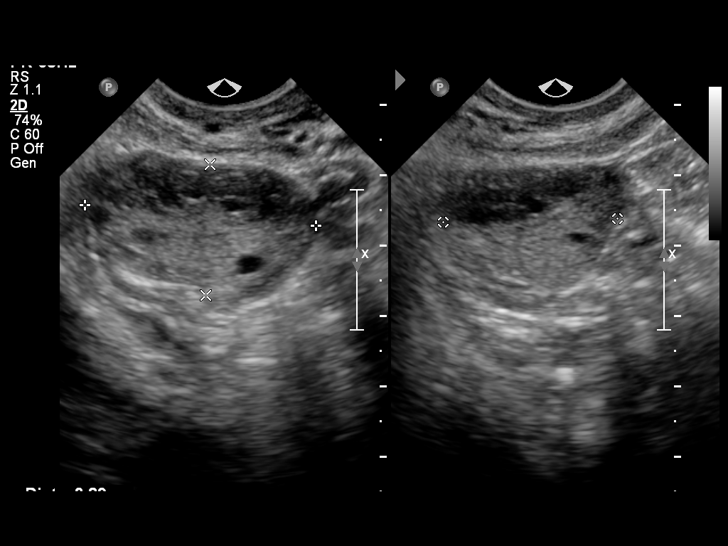
[im 47/47]
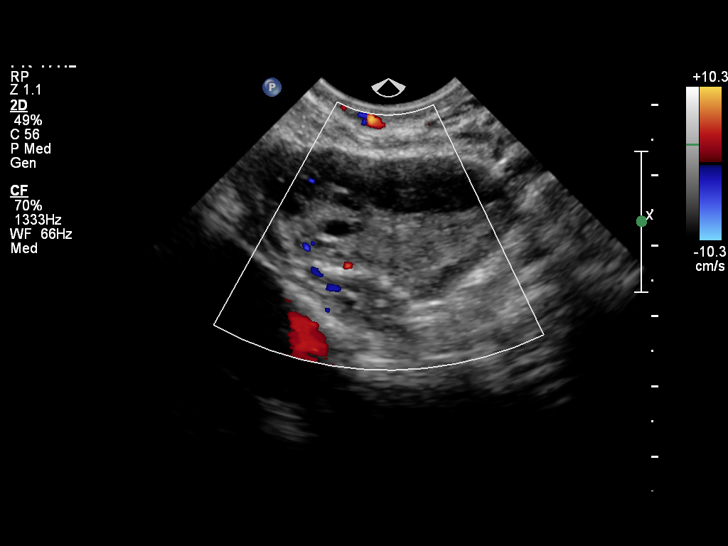

[14 of 25 positions shown; findings below may reference images not displayed]

It was necessary to proceed with endovaginal exam following the
transabdominal exam to visualize the endometrium.
FINDINGS: Uterus: Normal in size and appearance

Endometrium: Normal in thickness and appearance.  A small amount of
fluid is seen scattered in the endometrium.

Right ovary:  Normal appearance/no adnexal mass

Left ovary: Normal appearance/no adnexal mass

Other findings: No free fluid
IMPRESSION: Normal study. No evidence of pelvic mass or other significant
abnormality.

## 2013-08-13 IMAGING — CT CT ABD-PELV W/O CM
1 series · 14 of 18 positions shown, 19 images · non-contrast
Comparison: 12/02/1999 by report only

CLINICAL DATA: Back and abdominal pain.  Recent urinary tract
infection.

CT ABDOMEN AND PELVIS WITHOUT CONTRAST
TECHNIQUE: Multidetector CT imaging of the abdomen and pelvis was
performed following the standard protocol without intravenous
contrast.

[Series 4: lung · axial · 0.63mm/px · z∈[-92,-16]mm · 14 of 18 slices shown, 19 images]
[im 2/18  soft-tissue]
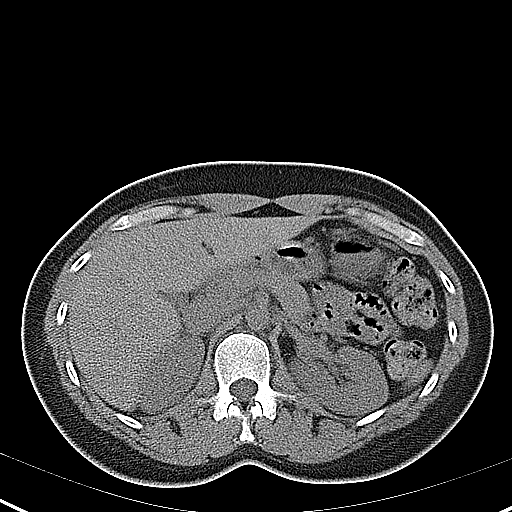
[im 2/18  bone]
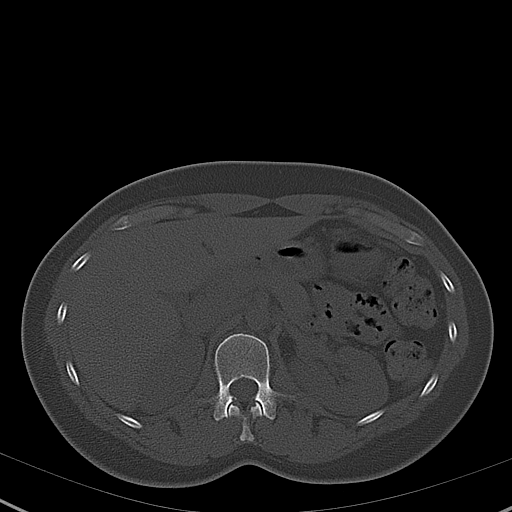
[im 3/18  soft-tissue]
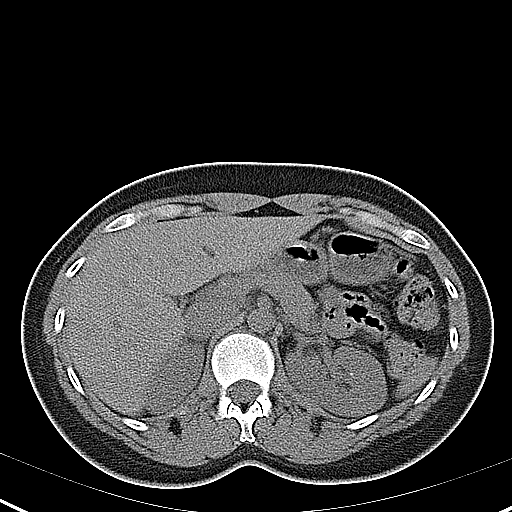
[im 5/18  soft-tissue]
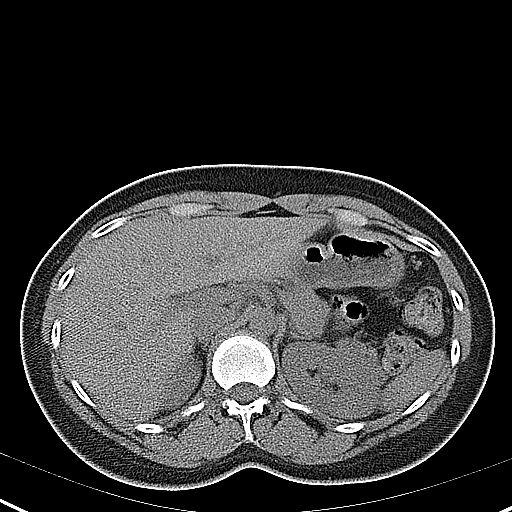
[im 6/18  soft-tissue]
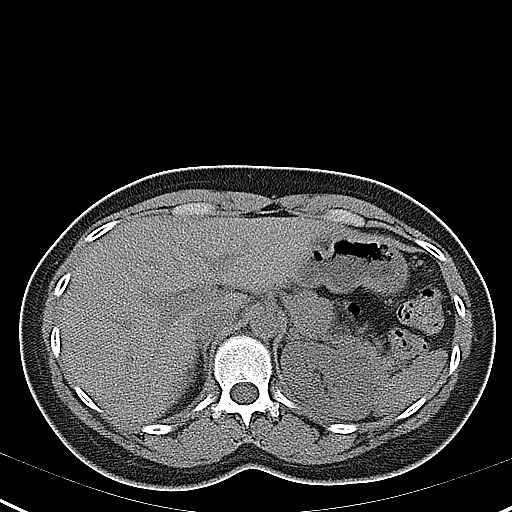
[im 7/18  soft-tissue]
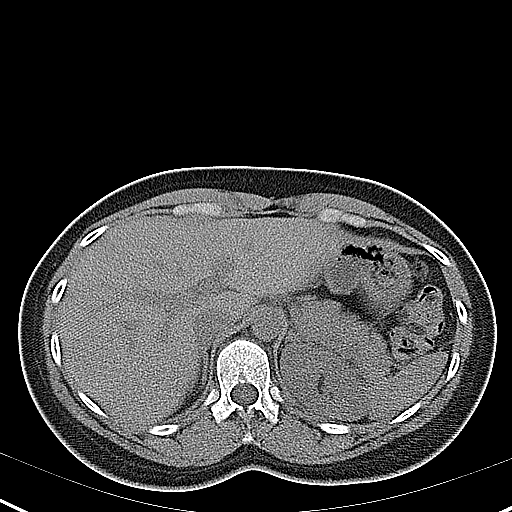
[im 8/18  soft-tissue]
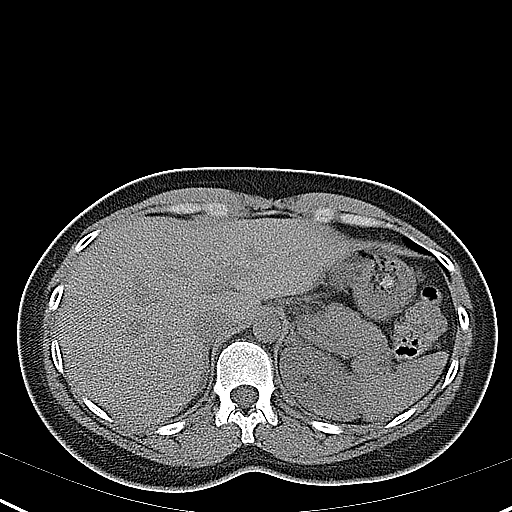
[im 10/18  soft-tissue]
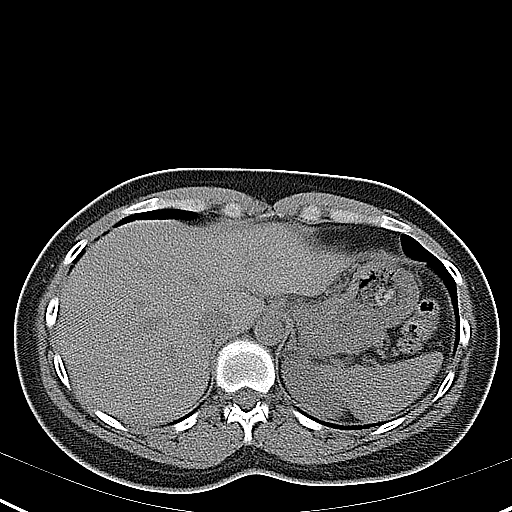
[im 11/18  soft-tissue]
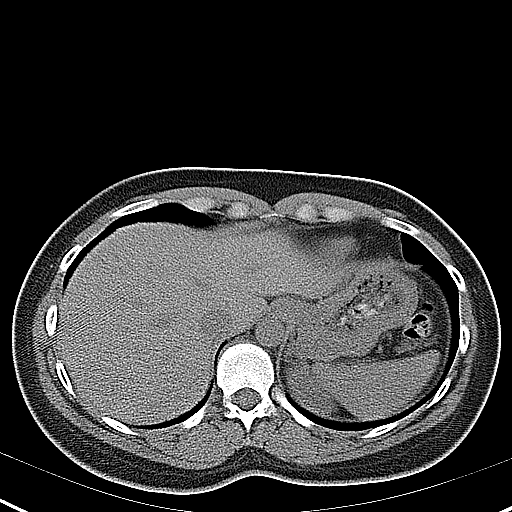
[im 12/18  soft-tissue]
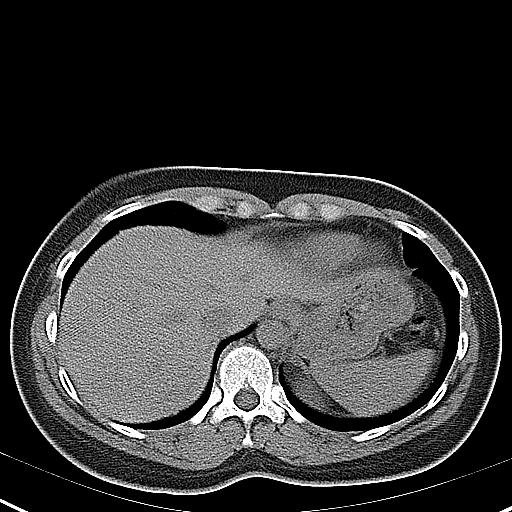
[im 12/18  bone]
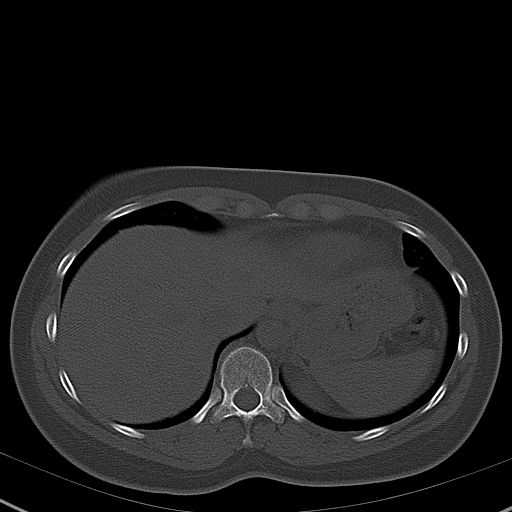
[im 13/18  soft-tissue]
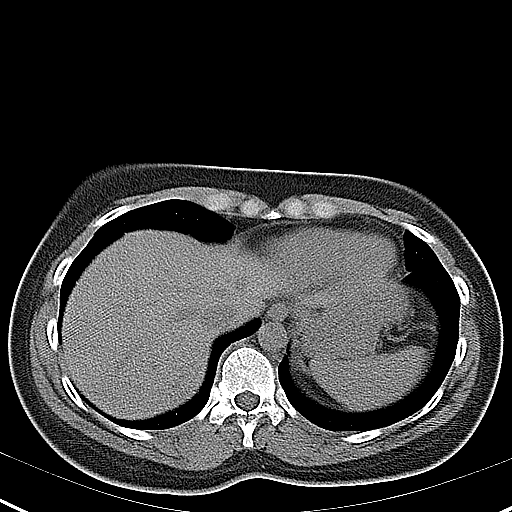
[im 14/18  soft-tissue]
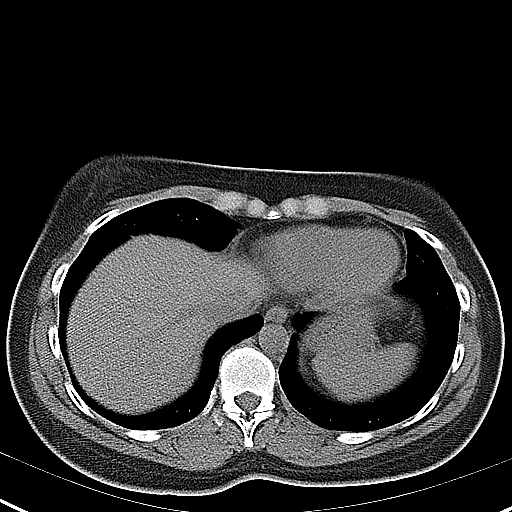
[im 14/18  lung]
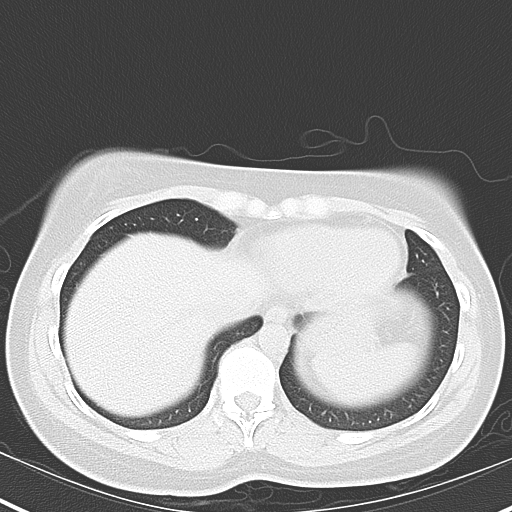
[im 15/18  lung]
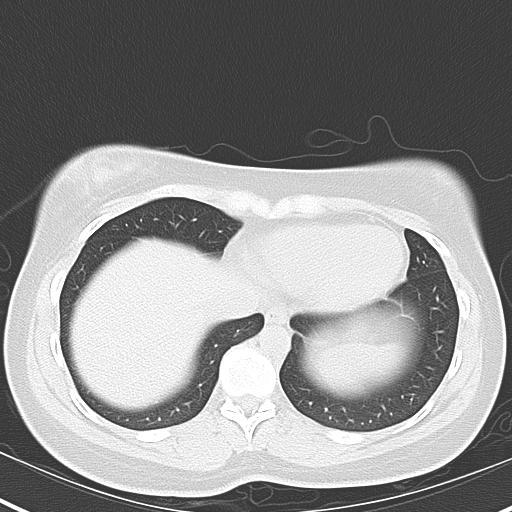
[im 16/18  soft-tissue]
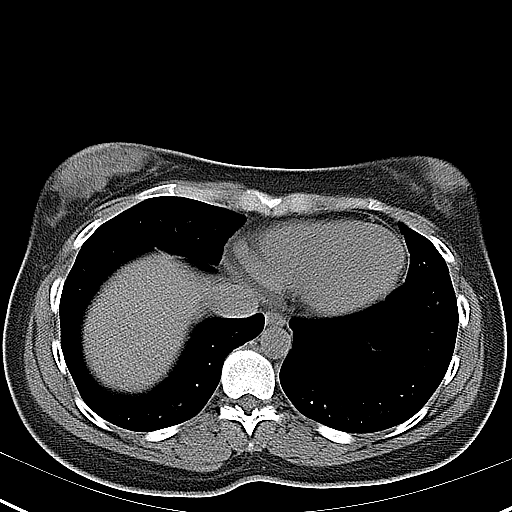
[im 16/18  lung]
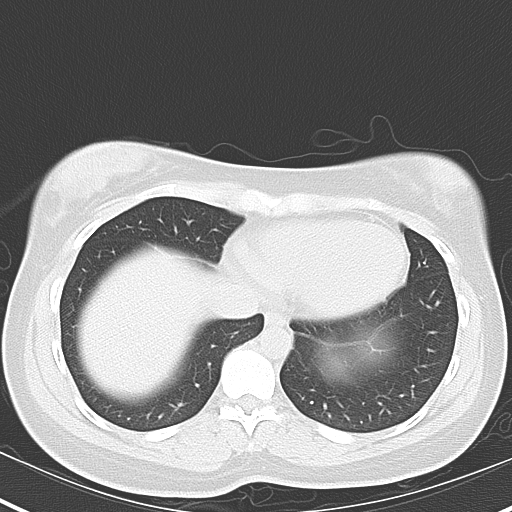
[im 17/18  soft-tissue]
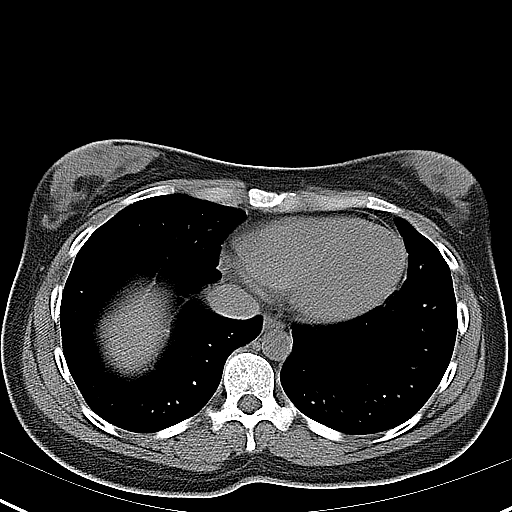
[im 17/18  lung]
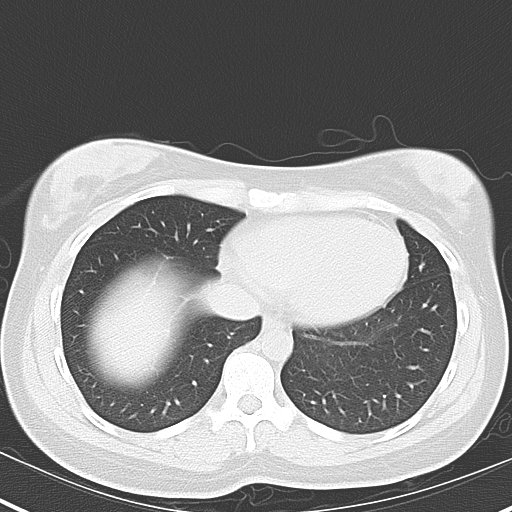

[14 of 18 positions shown; findings below may reference images not displayed]

FINDINGS: Visualized lung bases clear.  Unremarkable uninfused
evaluation of liver, gallbladder, spleen, adrenal glands, pancreas,
abdominal aorta.

There is bilateral nephrolithiasis, largest stone on the left 1 mm
in the lower pole, on the right 1-2 mm in the upper pole.  No
hydronephrosis, ureterectasis, or definite ureteral calculus.
Urinary bladder is incompletely distended.  Small bilateral pelvic
phleboliths.

Stomach and small bowel are nondistended.  Normal appendix.
Moderate fecal material in the proximal colon, decompressed
distally.  Uterus and adnexal regions grossly unremarkable.  No
ascites.  No free air.  No adenopathy localized.  Regional bones
unremarkable.
IMPRESSION: 1.  Bilateral nephrolithiasis without hydronephrosis or ureteral
calculus.
2.  No acute abdominal process.

## 2014-01-17 ENCOUNTER — Encounter (HOSPITAL_BASED_OUTPATIENT_CLINIC_OR_DEPARTMENT_OTHER): Payer: Self-pay | Admitting: Emergency Medicine

## 2014-01-17 ENCOUNTER — Emergency Department (HOSPITAL_BASED_OUTPATIENT_CLINIC_OR_DEPARTMENT_OTHER)
Admission: EM | Admit: 2014-01-17 | Discharge: 2014-01-17 | Disposition: A | Discharge status: Home / Self Care | Payer: BC Managed Care – PPO | Attending: Emergency Medicine | Admitting: Emergency Medicine

## 2014-01-17 DIAGNOSIS — Z8742 Personal history of other diseases of the female genital tract: Secondary | ICD-10-CM | POA: Insufficient documentation

## 2014-01-17 DIAGNOSIS — Z79899 Other long term (current) drug therapy: Secondary | ICD-10-CM | POA: Insufficient documentation

## 2014-01-17 DIAGNOSIS — Z792 Long term (current) use of antibiotics: Secondary | ICD-10-CM | POA: Diagnosis not present

## 2014-01-17 DIAGNOSIS — K5909 Other constipation: Secondary | ICD-10-CM | POA: Diagnosis not present

## 2014-01-17 DIAGNOSIS — R1030 Lower abdominal pain, unspecified: Secondary | ICD-10-CM | POA: Diagnosis not present

## 2014-01-17 DIAGNOSIS — Z3202 Encounter for pregnancy test, result negative: Secondary | ICD-10-CM | POA: Insufficient documentation

## 2014-01-17 DIAGNOSIS — Z8639 Personal history of other endocrine, nutritional and metabolic disease: Secondary | ICD-10-CM | POA: Diagnosis not present

## 2014-01-17 DIAGNOSIS — R109 Unspecified abdominal pain: Secondary | ICD-10-CM | POA: Diagnosis present

## 2014-01-17 LAB — COMPREHENSIVE METABOLIC PANEL
ALT: 10 U/L (ref 0–35)
AST: 16 U/L (ref 0–37)
Albumin: 4.4 g/dL (ref 3.5–5.2)
Alkaline Phosphatase: 58 U/L (ref 39–117)
Anion gap: 13 (ref 5–15)
BILIRUBIN TOTAL: 0.6 mg/dL (ref 0.3–1.2)
BUN: 9 mg/dL (ref 6–23)
CALCIUM: 9.6 mg/dL (ref 8.4–10.5)
CHLORIDE: 102 meq/L (ref 96–112)
CO2: 24 mEq/L (ref 19–32)
CREATININE: 0.8 mg/dL (ref 0.50–1.10)
GLUCOSE: 106 mg/dL — AB (ref 70–99)
Potassium: 3.7 mEq/L (ref 3.7–5.3)
Sodium: 139 mEq/L (ref 137–147)
Total Protein: 7.7 g/dL (ref 6.0–8.3)

## 2014-01-17 LAB — URINALYSIS, ROUTINE W REFLEX MICROSCOPIC
Bilirubin Urine: NEGATIVE
GLUCOSE, UA: NEGATIVE mg/dL
HGB URINE DIPSTICK: NEGATIVE
KETONES UR: 15 mg/dL — AB
Nitrite: NEGATIVE
PROTEIN: NEGATIVE mg/dL
Specific Gravity, Urine: 1.024 (ref 1.005–1.030)
Urobilinogen, UA: 0.2 mg/dL (ref 0.0–1.0)
pH: 5.5 (ref 5.0–8.0)

## 2014-01-17 LAB — CBC
HEMATOCRIT: 38 % (ref 36.0–46.0)
Hemoglobin: 12.9 g/dL (ref 12.0–15.0)
MCH: 31.1 pg (ref 26.0–34.0)
MCHC: 33.9 g/dL (ref 30.0–36.0)
MCV: 91.6 fL (ref 78.0–100.0)
Platelets: 132 10*3/uL — ABNORMAL LOW (ref 150–400)
RBC: 4.15 MIL/uL (ref 3.87–5.11)
RDW: 12.8 % (ref 11.5–15.5)
WBC: 11.3 10*3/uL — AB (ref 4.0–10.5)

## 2014-01-17 LAB — LIPASE, BLOOD: LIPASE: 16 U/L (ref 11–59)

## 2014-01-17 LAB — URINE MICROSCOPIC-ADD ON

## 2014-01-17 LAB — PREGNANCY, URINE: Preg Test, Ur: NEGATIVE

## 2014-01-17 MED ORDER — DICYCLOMINE HCL 10 MG PO CAPS
10.0000 mg | ORAL_CAPSULE | Freq: Once | ORAL | Status: AC
  Administered 2014-01-17: 10 mg via ORAL
  Filled 2014-01-17: qty 1

## 2014-01-17 MED ORDER — DICYCLOMINE HCL 20 MG PO TABS: 20.0000 mg | ORAL_TABLET | Freq: Two times a day (BID) | ORAL | Status: DC

## 2014-01-17 MED ORDER — DICYCLOMINE HCL 10 MG PO CAPS: 10.0000 mg | ORAL_CAPSULE | Freq: Once | ORAL | Status: DC

## 2014-01-17 MED ORDER — POLYETHYLENE GLYCOL 3350 17 G PO PACK: 17.0000 g | PACK | Freq: Every day | ORAL | Status: DC

## 2014-01-17 MED ORDER — DOCUSATE SODIUM 100 MG PO CAPS: 100.0000 mg | ORAL_CAPSULE | Freq: Two times a day (BID) | ORAL | Status: DC

## 2014-01-17 NOTE — ED Notes (Signed)
appx 2 hours ago began having a sharp pain in the center of her abd. Denies N/V/D, states she feels like she may need to go to the bathroom.

## 2014-01-17 NOTE — ED Provider Notes (Signed)
Medical screening examination/treatment/procedure(s) were performed by non-physician practitioner and as supervising physician I was immediately available for consultation/collaboration.   EKG Interpretation None        Glynn OctaveStephen Krystal Delduca, MD 01/17/14 2316

## 2014-01-17 NOTE — Discharge Instructions (Signed)
Take bentyl as directed for your pain. Take colace and miralax daily as prescribed. Increase fiber in your diet.   Abdominal Pain, Women Abdominal (stomach, pelvic, or belly) pain can be caused by many things. It is important to tell your doctor:  The location of the pain.  Does it come and go or is it present all the time?  Are there things that start the pain (eating certain foods, exercise)?  Are there other symptoms associated with the pain (fever, nausea, vomiting, diarrhea)? All of this is helpful to know when trying to find the cause of the pain. CAUSES   Stomach: virus or bacteria infection, or ulcer.  Intestine: appendicitis (inflamed appendix), regional ileitis (Crohn's disease), ulcerative colitis (inflamed colon), irritable bowel syndrome, diverticulitis (inflamed diverticulum of the colon), or cancer of the stomach or intestine.  Gallbladder disease or stones in the gallbladder.  Kidney disease, kidney stones, or infection.  Pancreas infection or cancer.  Fibromyalgia (pain disorder).  Diseases of the female organs:  Uterus: fibroid (non-cancerous) tumors or infection.  Fallopian tubes: infection or tubal pregnancy.  Ovary: cysts or tumors.  Pelvic adhesions (scar tissue).  Endometriosis (uterus lining tissue growing in the pelvis and on the pelvic organs).  Pelvic congestion syndrome (female organs filling up with blood just before the menstrual period).  Pain with the menstrual period.  Pain with ovulation (producing an egg).  Pain with an IUD (intrauterine device, birth control) in the uterus.  Cancer of the female organs.  Functional pain (pain not caused by a disease, may improve without treatment).  Psychological pain.  Depression. DIAGNOSIS  Your doctor will decide the seriousness of your pain by doing an examination.  Blood tests.  X-rays.  Ultrasound.  CT scan (computed tomography, special type of X-ray).  MRI (magnetic resonance  imaging).  Cultures, for infection.  Barium enema (dye inserted in the large intestine, to better view it with X-rays).  Colonoscopy (looking in intestine with a lighted tube).  Laparoscopy (minor surgery, looking in abdomen with a lighted tube).  Major abdominal exploratory surgery (looking in abdomen with a large incision). TREATMENT  The treatment will depend on the cause of the pain.   Many cases can be observed and treated at home.  Over-the-counter medicines recommended by your caregiver.  Prescription medicine.  Antibiotics, for infection.  Birth control pills, for painful periods or for ovulation pain.  Hormone treatment, for endometriosis.  Nerve blocking injections.  Physical therapy.  Antidepressants.  Counseling with a psychologist or psychiatrist.  Minor or major surgery. HOME CARE INSTRUCTIONS   Do not take laxatives, unless directed by your caregiver.  Take over-the-counter pain medicine only if ordered by your caregiver. Do not take aspirin because it can cause an upset stomach or bleeding.  Try a clear liquid diet (broth or water) as ordered by your caregiver. Slowly move to a bland diet, as tolerated, if the pain is related to the stomach or intestine.  Have a thermometer and take your temperature several times a day, and record it.  Bed rest and sleep, if it helps the pain.  Avoid sexual intercourse, if it causes pain.  Avoid stressful situations.  Keep your follow-up appointments and tests, as your caregiver orders.  If the pain does not go away with medicine or surgery, you may try:  Acupuncture.  Relaxation exercises (yoga, meditation).  Group therapy.  Counseling. SEEK MEDICAL CARE IF:   You notice certain foods cause stomach pain.  Your home care treatment is  not helping your pain.  You need stronger pain medicine.  You want your IUD removed.  You feel faint or lightheaded.  You develop nausea and vomiting.  You  develop a rash.  You are having side effects or an allergy to your medicine. SEEK IMMEDIATE MEDICAL CARE IF:   Your pain does not go away or gets worse.  You have a fever.  Your pain is felt only in portions of the abdomen. The right side could possibly be appendicitis. The left lower portion of the abdomen could be colitis or diverticulitis.  You are passing blood in your stools (bright red or black tarry stools, with or without vomiting).  You have blood in your urine.  You develop chills, with or without a fever.  You pass out. MAKE SURE YOU:   Understand these instructions.  Will watch your condition.  Will get help right away if you are not doing well or get worse. Document Released: 01/15/2007 Document Revised: 08/04/2013 Document Reviewed: 02/04/2009 [facility] Patient Information 2015 [place], [place]. This information is not intended to replace advice given to you by your health care provider. Make sure you discuss any questions you have with your health care provider.  Abdominal Pain Many things can cause abdominal pain. Usually, abdominal pain is not caused by a disease and will improve without treatment. It can often be observed and treated at home. Your health care provider will do a physical exam and possibly order blood tests and X-rays to help determine the seriousness of your pain. However, in many cases, more time must pass before a clear cause of the pain can be found. Before that point, your health care provider may not know if you need more testing or further treatment. HOME CARE INSTRUCTIONS  Monitor your abdominal pain for any changes. The following actions may help to alleviate any discomfort you are experiencing:  Only take over-the-counter or prescription medicines as directed by your health care provider.  Do not take laxatives unless directed to do so by your health care provider.  Try a clear liquid diet (broth, tea, or water) as directed by your health  care provider. Slowly move to a bland diet as tolerated. SEEK MEDICAL CARE IF:  You have unexplained abdominal pain.  You have abdominal pain associated with nausea or diarrhea.  You have pain when you urinate or have a bowel movement.  You experience abdominal pain that wakes you in the night.  You have abdominal pain that is worsened or improved by eating food.  You have abdominal pain that is worsened with eating fatty foods.  You have a fever. SEEK IMMEDIATE MEDICAL CARE IF:   Your pain does not go away within 2 hours.  You keep throwing up (vomiting).  Your pain is felt only in portions of the abdomen, such as the right side or the left lower portion of the abdomen.  You pass bloody or black tarry stools. MAKE SURE YOU:  Understand these instructions.   Will watch your condition.   Will get help right away if you are not doing well or get worse.  Document Released: 12/28/2004 Document Revised: 03/25/2013 Document Reviewed: 11/27/2012 [facility] Patient Information 2015 [place], [place]. This information is not intended to replace advice given to you by your health care provider. Make sure you discuss any questions you have with your health care provider.  Constipation Constipation is when a person has fewer than three bowel movements a week, has difficulty having a bowel movement, or  has stools that are dry, hard, or larger than normal. As people grow older, constipation is more common. If you try to fix constipation with medicines that make you have a bowel movement (laxatives), the problem may get worse. Long-term laxative use may cause the muscles of the colon to become weak. A low-fiber diet, not taking in enough fluids, and taking certain medicines may make constipation worse.  CAUSES   Certain medicines, such as antidepressants, pain medicine, iron supplements, antacids, and water pills.   Certain diseases, such as diabetes, irritable bowel syndrome (IBS), thyroid  disease, or depression.   Not drinking enough water.   Not eating enough fiber-rich foods.   Stress or travel.   Lack of physical activity or exercise.   Ignoring the urge to have a bowel movement.   Using laxatives too much.  SIGNS AND SYMPTOMS   Having fewer than three bowel movements a week.   Straining to have a bowel movement.   Having stools that are hard, dry, or larger than normal.   Feeling full or bloated.   Pain in the lower abdomen.   Not feeling relief after having a bowel movement.  DIAGNOSIS  Your health care provider will take a medical history and perform a physical exam. Further testing may be done for severe constipation. Some tests may include:  A barium enema X-ray to examine your rectum, colon, and, sometimes, your small intestine.   A sigmoidoscopy to examine your lower colon.   A colonoscopy to examine your entire colon. TREATMENT  Treatment will depend on the severity of your constipation and what is causing it. Some dietary treatments include drinking more fluids and eating more fiber-rich foods. Lifestyle treatments may include regular exercise. If these diet and lifestyle recommendations do not help, your health care provider may recommend taking over-the-counter laxative medicines to help you have bowel movements. Prescription medicines may be prescribed if over-the-counter medicines do not work.  HOME CARE INSTRUCTIONS   Eat foods that have a lot of fiber, such as fruits, vegetables, whole grains, and beans.  Limit foods high in fat and processed sugars, such as french fries, hamburgers, cookies, candies, and soda.   A fiber supplement may be added to your diet if you cannot get enough fiber from foods.   Drink enough fluids to keep your urine clear or pale yellow.   Exercise regularly or as directed by your health care provider.   Go to the restroom when you have the urge to go. Do not hold it.   Only take  over-the-counter or prescription medicines as directed by your health care provider. Do not take other medicines for constipation without talking to your health care provider first.  SEEK IMMEDIATE MEDICAL CARE IF:   You have bright red blood in your stool.   Your constipation lasts for more than 4 days or gets worse.   You have abdominal or rectal pain.   You have thin, pencil-like stools.   You have unexplained weight loss. MAKE SURE YOU:   Understand these instructions.  Will watch your condition.  Will get help right away if you are not doing well or get worse. Document Released: 12/17/2003 Document Revised: 03/25/2013 Document Reviewed: 12/30/2012 [facility] Patient Information 2015 [place], [place]. This information is not intended to replace advice given to you by your health care provider. Make sure you discuss any questions you have with your health care provider. High-Fiber Diet Fiber is found in fruits, vegetables, and grains. A high-fiber diet  encourages the addition of more whole grains, legumes, fruits, and vegetables in your diet. The recommended amount of fiber for adult males is 38 g per day. For adult females, it is 25 g per day. Pregnant and lactating women should get 28 g of fiber per day. If you have a digestive or bowel problem, ask your caregiver for advice before adding high-fiber foods to your diet. Eat a variety of high-fiber foods instead of only a select few type of foods.  PURPOSE  To increase stool bulk.  To make bowel movements more regular to prevent constipation.  To lower cholesterol.  To prevent overeating. WHEN IS THIS DIET USED?  It may be used if you have constipation and hemorrhoids.  It may be used if you have uncomplicated diverticulosis (intestine condition) and irritable bowel syndrome.  It may be used if you need help with weight management.  It may be used if you want to add it to your diet as a protective measure against  atherosclerosis, diabetes, and cancer. SOURCES OF FIBER  Whole-grain breads and cereals.  Fruits, such as apples, oranges, bananas, berries, prunes, and pears.  Vegetables, such as green peas, carrots, sweet potatoes, beets, broccoli, cabbage, spinach, and artichokes.  Legumes, such split peas, soy, lentils.  Almonds. FIBER CONTENT IN FOODS Starches and Grains / Dietary Fiber (g)  Cheerios, 1 cup / 3 g  Corn Flakes cereal, 1 cup / 0.7 g  Rice crispy treat cereal, 1 cup / 0.3 g  Instant oatmeal (cooked),  cup / 2 g  Frosted wheat cereal, 1 cup / 5.1 g  Brown, long-grain rice (cooked), 1 cup / 3.5 g  White, long-grain rice (cooked), 1 cup / 0.6 g  Enriched macaroni (cooked), 1 cup / 2.5 g Legumes / Dietary Fiber (g)  Baked beans (canned, plain, or vegetarian),  cup / 5.2 g  Kidney beans (canned),  cup / 6.8 g  Pinto beans (cooked),  cup / 5.5 g Breads and Crackers / Dietary Fiber (g)  Plain or honey graham crackers, 2 squares / 0.7 g  Saltine crackers, 3 squares / 0.3 g  Plain, salted pretzels, 10 pieces / 1.8 g  Whole-wheat bread, 1 slice / 1.9 g  White bread, 1 slice / 0.7 g  Raisin bread, 1 slice / 1.2 g  Plain bagel, 3 oz / 2 g  Flour tortilla, 1 oz / 0.9 g  Corn tortilla, 1 small / 1.5 g  Hamburger or hotdog bun, 1 small / 0.9 g Fruits / Dietary Fiber (g)  Apple with skin, 1 medium / 4.4 g  Sweetened applesauce,  cup / 1.5 g  Banana,  medium / 1.5 g  Grapes, 10 grapes / 0.4 g  Orange, 1 small / 2.3 g  Raisin, 1.5 oz / 1.6 g  Melon, 1 cup / 1.4 g Vegetables / Dietary Fiber (g)  Green beans (canned),  cup / 1.3 g  Carrots (cooked),  cup / 2.3 g  Broccoli (cooked),  cup / 2.8 g  Peas (cooked),  cup / 4.4 g  Mashed potatoes,  cup / 1.6 g  Lettuce, 1 cup / 0.5 g  Corn (canned),  cup / 1.6 g  Tomato,  cup / 1.1 g Document Released: 03/20/2005 Document Revised: 09/19/2011 Document Reviewed: 06/22/2011 [employer]  Patient Information 2015 [place]. This information is not intended to replace advice given to you by your health care provider. Make sure you discuss any questions you have with your health care  provider.

## 2014-01-17 NOTE — ED Provider Notes (Signed)
CSN: [id number]     Arrival date & time 01/17/14  1846 History   First MD Initiated Contact with Patient 01/17/14 1856     Chief Complaint  Patient presents with  . Abdominal Pain     (Consider location/radiation/quality/duration/timing/severity/associated sxs/prior Treatment) HPI Comments: This is a 28 year old female with a past medical history of PCOS and ectopic pregnancy who presents to the emergency department complaining of sudden onset mid abdominal pain radiating across her abdomen beginning 2 hours prior to arrival. Pain is intermittent, coming and going at random, described as a sharp, cramping pain. Initially she thought she needed to move her bowels, however she went to the bathroom and was unable to do so. Last bowel movement was either yesterday or today before. States she is usually irregular with her bowel movements. Denies nausea or vomiting. Last menstrual period begin September 17 and she is due for her cycle. She is currently taking metformin for her PCOS in order to help her get pregnant. She is sexually active with her boyfriend. Denies vaginal bleeding or discharge, increased urinary frequency, urgency or dysuria.  Patient is a 28 y.o. female presenting with abdominal pain. The history is provided by the patient.  Abdominal Pain   Past Medical History  Diagnosis Date  . Ovarian cyst   . PCOS (polycystic ovarian syndrome)   . No pertinent past medical history   . Ectopic pregnancy    Past Surgical History  Procedure Laterality Date  . Wisdom tooth extraction     Family History  Problem Relation Age of Onset  . Hypertension Mother   . Hypertension Father    History  Substance Use Topics  . Smoking status: Never Smoker   . Smokeless tobacco: Never Used  . Alcohol Use: No   OB History   Grav Para Term Preterm Abortions TAB SAB Ect Mult Living   1    1   1        Review of Systems  Gastrointestinal: Positive for abdominal pain.  All other systems  reviewed and are negative.     Allergies  Review of patient's allergies indicates no known allergies.  Home Medications   Prior to Admission medications   Medication Sig Start Date End Date Taking? Authorizing Provider  dicyclomine (BENTYL) 20 MG tablet Take 1 tablet (20 mg total) by mouth 2 (two) times daily. 01/17/14   Kathrynn Speedobyn M Petro Talent, PA-C  docusate sodium (COLACE) 100 MG capsule Take 1 capsule (100 mg total) by mouth every 12 (twelve) hours. 01/17/14   Orie Baxendale M Rosetta Rupnow, PA-C  polyethylene glycol (MIRALAX / GLYCOLAX) packet Take 17 g by mouth daily. 01/17/14   Milbert Bixler M Quintavia Rogstad, PA-C  sulfamethoxazole-trimethoprim (BACTRIM,SEPTRA) 400-80 MG per tablet Take 1 tablet by mouth 2 (two) times daily.    Historical Provider, MD   BP 140/90  Pulse 84  Temp(Src) 98.3 F (36.8 C) (Oral)  Resp 18  Ht 5\' 2"  (1.575 m)  Wt 140 lb (63.504 kg)  BMI 25.60 kg/m2  SpO2 100%  LMP 12/18/2013 Physical Exam  Nursing note and vitals reviewed. Constitutional: She is oriented to person, place, and time. She appears well-developed and well-nourished. No distress.  Uncomfortable but in NAD.  HENT:  Head: Normocephalic and atraumatic.  Mouth/Throat: Oropharynx is clear and moist.  Eyes: Conjunctivae are normal.  Neck: Normal range of motion. Neck supple.  Cardiovascular: Normal rate, regular rhythm and normal heart sounds.   Pulmonary/Chest: Effort normal and breath sounds normal.  Abdominal: Soft. Normal appearance  and bowel sounds are normal. She exhibits no distension. There is tenderness. There is guarding. There is no rigidity and no rebound.  Generalized tenderness across the mid to lower abdomen. No specific focal tenderness. No peritoneal signs.  Musculoskeletal: Normal range of motion. She exhibits no edema.  Neurological: She is alert and oriented to person, place, and time.  Skin: Skin is warm and dry. She is not diaphoretic.  Psychiatric: She has a normal mood and affect. Her behavior is normal.     ED Course  Procedures (including critical care time) Labs Review Labs Reviewed  URINALYSIS, ROUTINE W REFLEX MICROSCOPIC - Abnormal; Notable for the following:    APPearance CLOUDY (*)    Ketones, ur 15 (*)    Leukocytes, UA SMALL (*)    All other components within normal limits  CBC - Abnormal; Notable for the following:    WBC 11.3 (*)    Platelets 132 (*)    All other components within normal limits  COMPREHENSIVE METABOLIC PANEL - Abnormal; Notable for the following:    Glucose, Bld 106 (*)    All other components within normal limits  URINE MICROSCOPIC-ADD ON - Abnormal; Notable for the following:    Squamous Epithelial / LPF MANY (*)    Bacteria, UA MANY (*)    All other components within normal limits  PREGNANCY, URINE  LIPASE, BLOOD    Imaging Review No results found.   EKG Interpretation None      MDM   Final diagnoses:  Abdominal pain, lower  Other constipation   Patient uncomfortable-appearing on arrival, but in no apparent distress. Afebrile, vital signs stable. Abdomen is soft with no peritoneal signs. Initial concern for possible ectopic pregnancy, pregnancy negative. Leukocytosis 11.3. Urinalysis contaminated with squamous epithelial cells. No urinary symptoms. Doubt urinary tract infection. Culture pending. Given patient's sensation of sharp cramping and aching to the bathroom, she was given Bentyl with complete resolution of her abdominal pain. On reexamination, patient states she now feels like there is "poop" that needs to come out. Abdomen is soft with very mild tenderness, significantly improved from initial exam. Doubt pain coming from the appendix, ovaries or any other intra-abdominal infection. Will discharge patient home with Colace, MiraLax and Bentyl. Advised increased fiber diet. Followup with PCP. Stable for discharge. Return precautions given. Patient states understanding of treatment care plan and is agreeable.   Kathrynn SpeedRobyn M Montana Bryngelson,  PA-C 01/17/14 2011

## 2014-01-19 LAB — URINE CULTURE
Colony Count: NO GROWTH
Culture: NO GROWTH

## 2014-02-02 ENCOUNTER — Encounter (HOSPITAL_BASED_OUTPATIENT_CLINIC_OR_DEPARTMENT_OTHER): Payer: Self-pay | Admitting: Emergency Medicine

## 2014-05-27 IMAGING — US TRANSABDOMINAL ULTRASOUND OF PELVIS
1 series · 14 of 25 positions shown · non-contrast
Comparison: none

REASON FOR EXAM: left adnexal tenderness/fullness
COMMENTS:

PROCEDURE:     US  - US PELVIS EXAM  - August 29, 2012  [DATE]
RESULT:     Ultrasound dated 08/29/2012
TECHNIQUE: Transabdominal imaging of the pelvis was obtained.

[Series 1: transabdominal ultrasound of pelvis · 0.20mm/px · 14 of 55 slices shown]
[im 1/55]
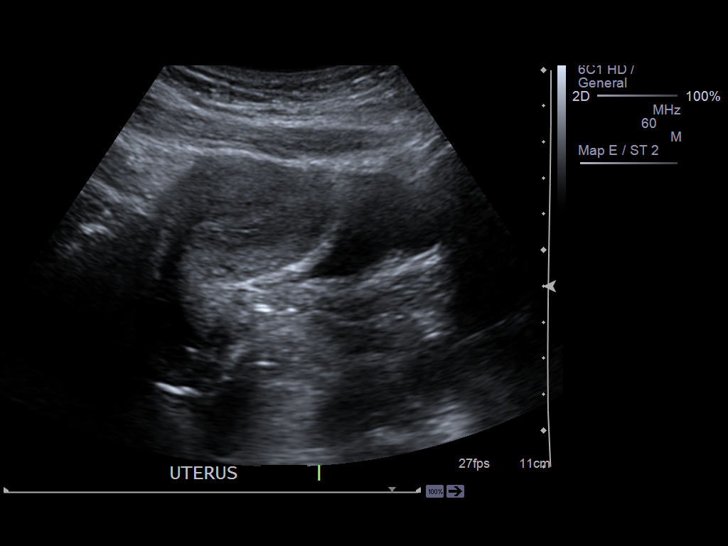
[im 5/55]
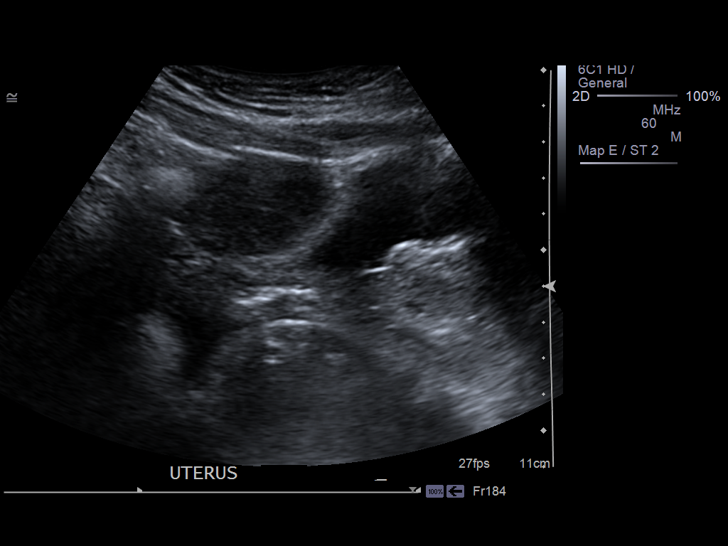
[im 10/55]
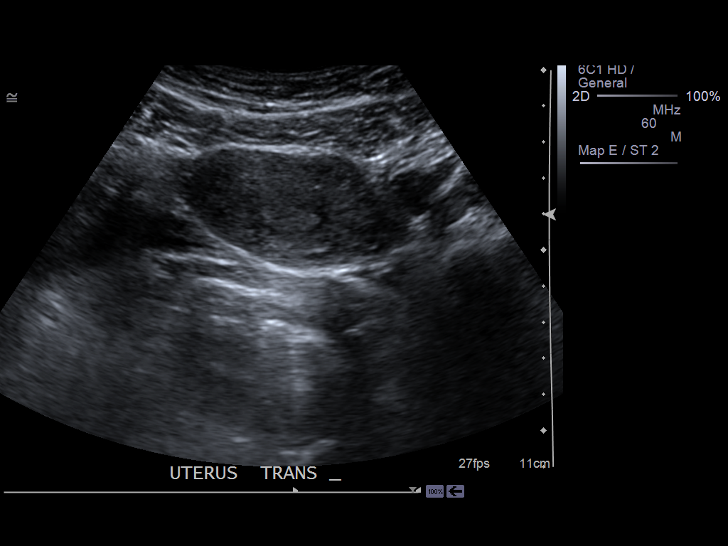
[im 14/55]
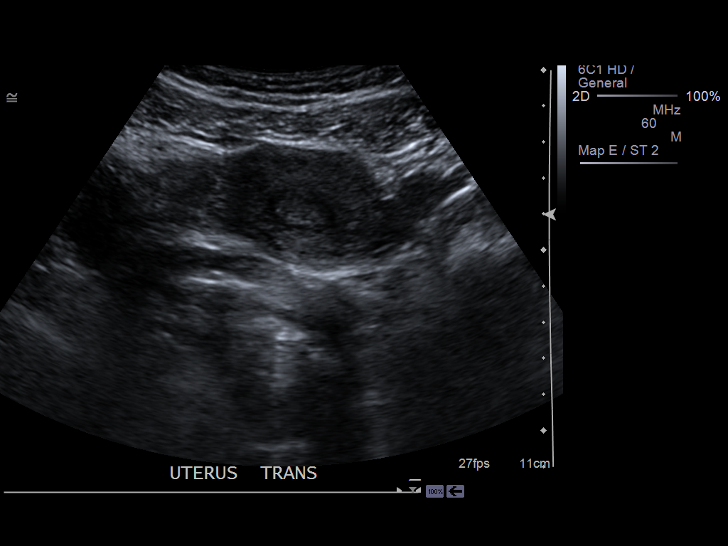
[im 19/55]
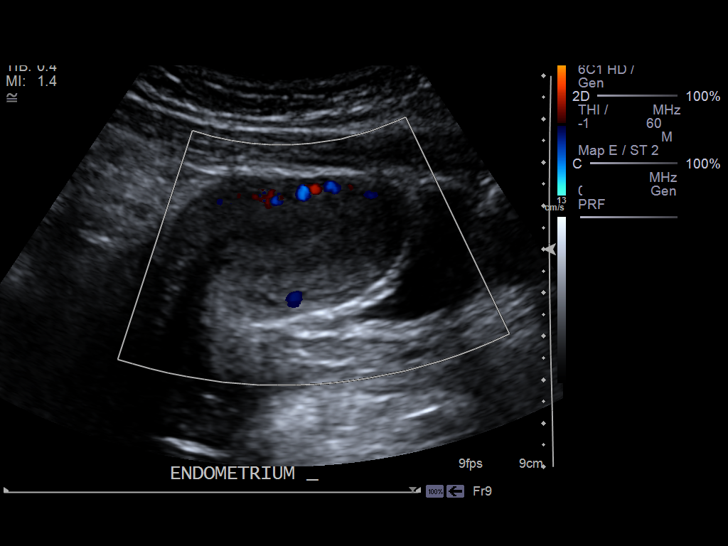
[im 21/55]
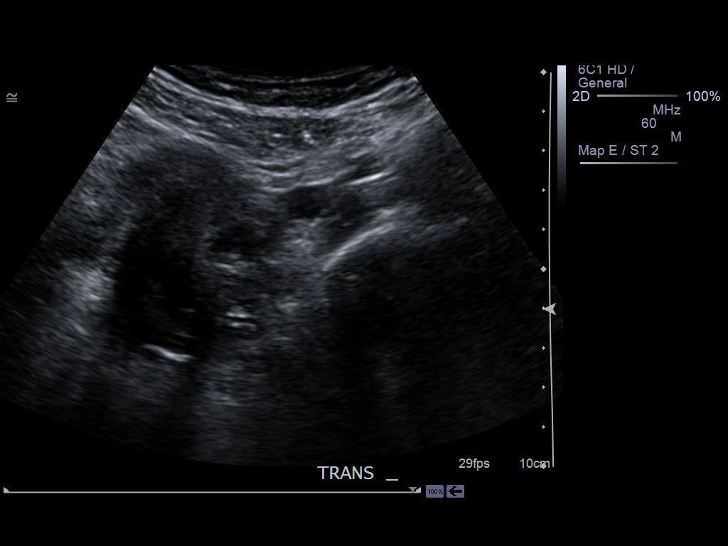
[im 25/55]
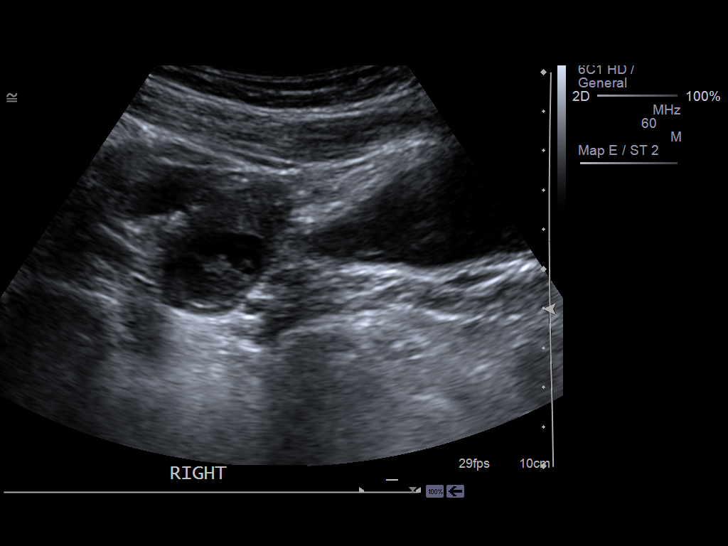
[im 30/55]
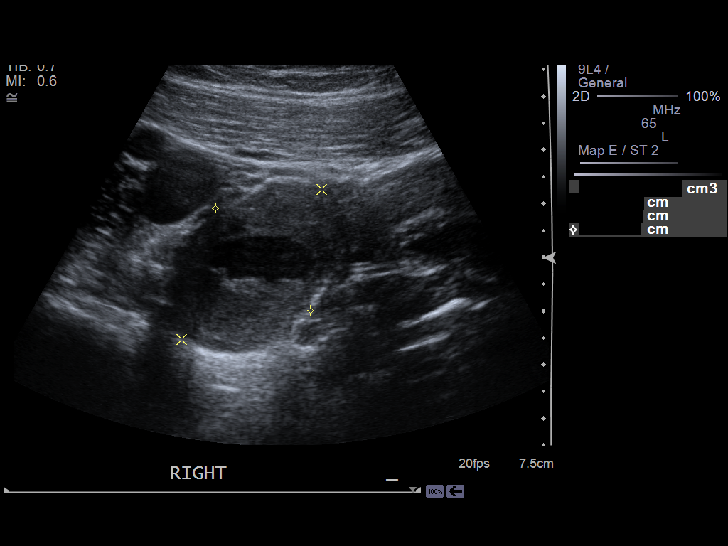
[im 34/55]
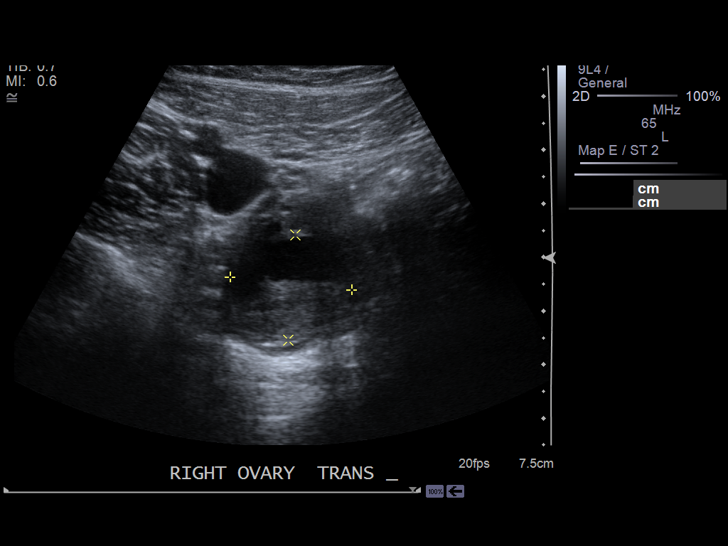
[im 37/55]
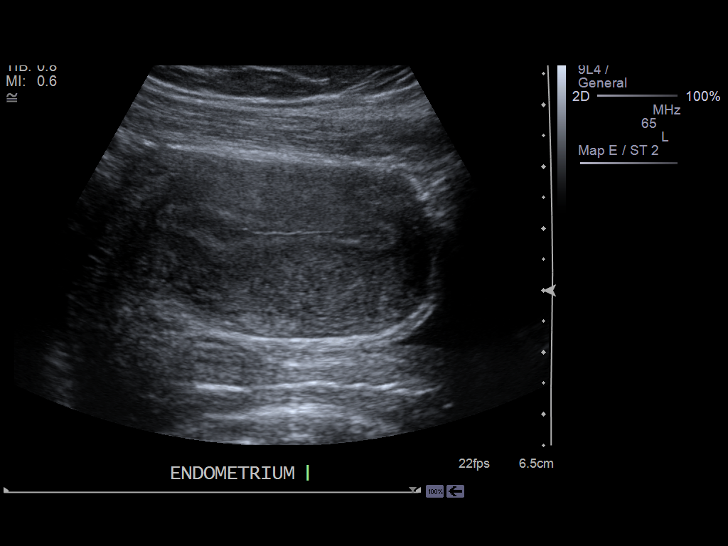
[im 41/55]
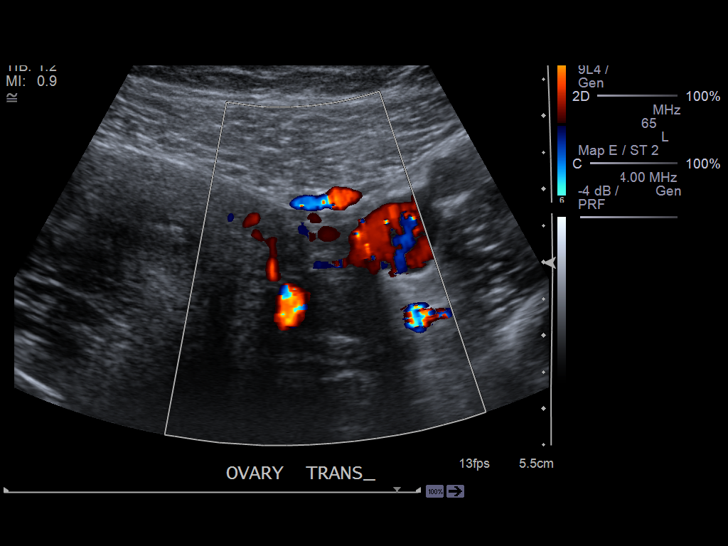
[im 46/55]
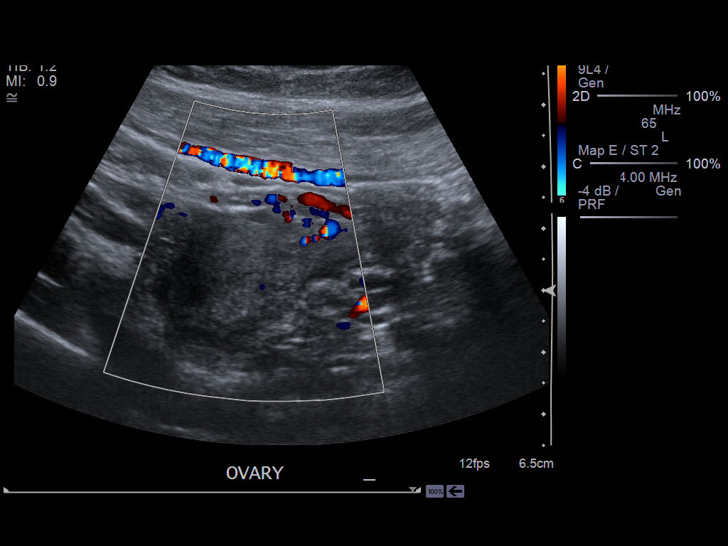
[im 50/55]
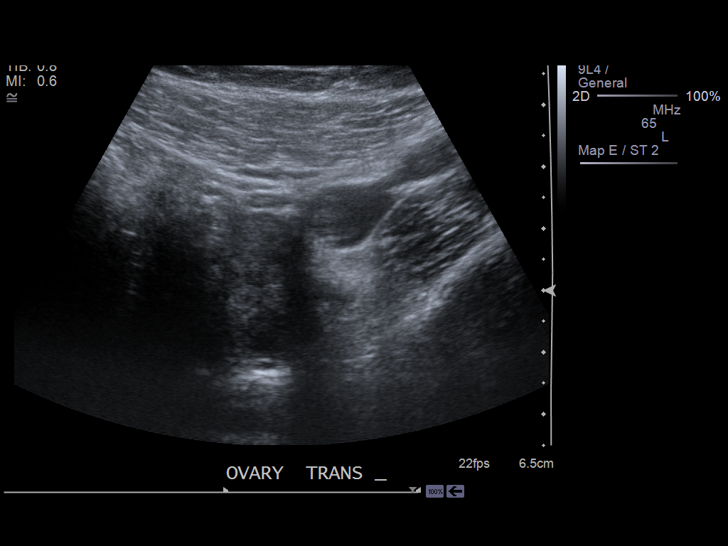
[im 55/55]
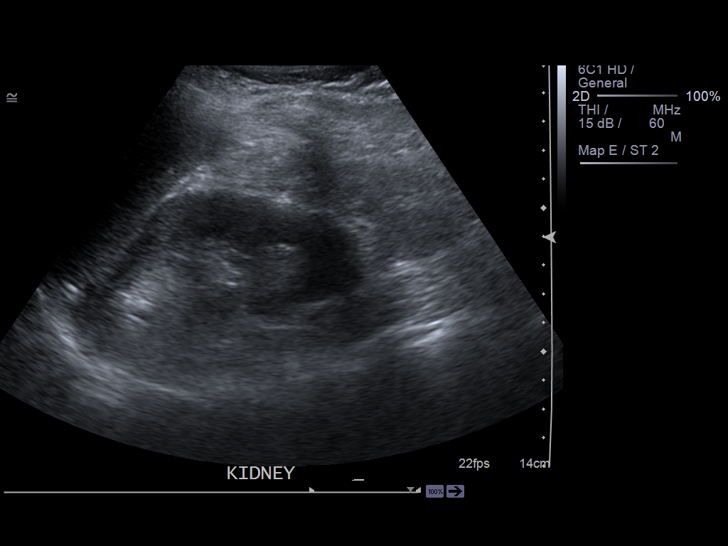

[14 of 25 positions shown; findings below may reference images not displayed]

FINDINGS: The uterus demonstrates a homogeneous echotexture and measures
9.17 x 3.22 x 5.5 cm. The endometrial thickness is 5.1 mm. There is no
evidence of pelvic free fluid, nor loculated fluid collections.

The right ovary measures 3.83 x 2.61 x 2.7 cm and the left 2.3 x 1.8 x
cm. Color filling of vessels identified within the right and left ovaries. A
complex partially cystic structure the involve the right ovary. Different
considerations are a hemorrhagic versus a complex cyst. An etiology such as
an endometrioma is also of diagnostic consideration. This area measures
x 1.97 x 2.54 cm. Surveillance evaluation in 6 to 8 weeks is recommended. No
further adnexal mass is identified.
IMPRESSION: Findings likely reflecting a complex hemorrhagic cyst
involving the right ovary. Surveillance evaluation is recommended.
2. Otherwise unremarkable pelvic ultrasound.

## 2016-04-03 HISTORY — PX: ABDOMINAL HYSTERECTOMY: SHX81

## 2017-06-12 DIAGNOSIS — I10 Essential (primary) hypertension: Secondary | ICD-10-CM | POA: Diagnosis not present

## 2017-06-12 DIAGNOSIS — L659 Nonscarring hair loss, unspecified: Secondary | ICD-10-CM | POA: Diagnosis not present

## 2017-06-12 DIAGNOSIS — B351 Tinea unguium: Secondary | ICD-10-CM | POA: Diagnosis not present

## 2017-06-12 DIAGNOSIS — N951 Menopausal and female climacteric states: Secondary | ICD-10-CM | POA: Diagnosis not present

## 2017-06-28 DIAGNOSIS — B351 Tinea unguium: Secondary | ICD-10-CM | POA: Diagnosis not present

## 2017-07-07 DIAGNOSIS — R1030 Lower abdominal pain, unspecified: Secondary | ICD-10-CM | POA: Diagnosis not present

## 2017-07-07 DIAGNOSIS — N3001 Acute cystitis with hematuria: Secondary | ICD-10-CM | POA: Diagnosis not present

## 2017-07-07 DIAGNOSIS — I10 Essential (primary) hypertension: Secondary | ICD-10-CM | POA: Diagnosis not present

## 2017-07-07 DIAGNOSIS — N939 Abnormal uterine and vaginal bleeding, unspecified: Secondary | ICD-10-CM | POA: Diagnosis not present

## 2017-07-09 DIAGNOSIS — N939 Abnormal uterine and vaginal bleeding, unspecified: Secondary | ICD-10-CM | POA: Diagnosis not present

## 2017-09-18 DIAGNOSIS — S63502A Unspecified sprain of left wrist, initial encounter: Secondary | ICD-10-CM | POA: Diagnosis not present

## 2017-09-18 DIAGNOSIS — M67832 Other specified disorders of synovium, left wrist: Secondary | ICD-10-CM | POA: Diagnosis not present

## 2017-09-24 DIAGNOSIS — F411 Generalized anxiety disorder: Secondary | ICD-10-CM | POA: Diagnosis not present

## 2017-09-24 DIAGNOSIS — K5901 Slow transit constipation: Secondary | ICD-10-CM | POA: Diagnosis not present

## 2017-09-24 DIAGNOSIS — S63502D Unspecified sprain of left wrist, subsequent encounter: Secondary | ICD-10-CM | POA: Diagnosis not present

## 2017-09-24 DIAGNOSIS — F320 Major depressive disorder, single episode, mild: Secondary | ICD-10-CM | POA: Diagnosis not present

## 2017-12-21 DIAGNOSIS — R102 Pelvic and perineal pain: Secondary | ICD-10-CM | POA: Diagnosis not present

## 2017-12-21 DIAGNOSIS — N760 Acute vaginitis: Secondary | ICD-10-CM | POA: Diagnosis not present

## 2017-12-21 DIAGNOSIS — N939 Abnormal uterine and vaginal bleeding, unspecified: Secondary | ICD-10-CM | POA: Diagnosis not present

## 2017-12-24 DIAGNOSIS — N760 Acute vaginitis: Secondary | ICD-10-CM | POA: Diagnosis not present

## 2018-03-30 DIAGNOSIS — N390 Urinary tract infection, site not specified: Secondary | ICD-10-CM | POA: Diagnosis not present

## 2018-03-30 DIAGNOSIS — R300 Dysuria: Secondary | ICD-10-CM | POA: Diagnosis not present

## 2018-04-23 DIAGNOSIS — Z118 Encounter for screening for other infectious and parasitic diseases: Secondary | ICD-10-CM | POA: Diagnosis not present

## 2018-04-23 DIAGNOSIS — Z01419 Encounter for gynecological examination (general) (routine) without abnormal findings: Secondary | ICD-10-CM | POA: Diagnosis not present

## 2018-04-23 DIAGNOSIS — R300 Dysuria: Secondary | ICD-10-CM | POA: Diagnosis not present

## 2018-04-23 DIAGNOSIS — Z114 Encounter for screening for human immunodeficiency virus [HIV]: Secondary | ICD-10-CM | POA: Diagnosis not present

## 2018-04-23 DIAGNOSIS — Z6827 Body mass index (BMI) 27.0-27.9, adult: Secondary | ICD-10-CM | POA: Diagnosis not present

## 2018-04-23 DIAGNOSIS — N390 Urinary tract infection, site not specified: Secondary | ICD-10-CM | POA: Diagnosis not present

## 2018-04-23 DIAGNOSIS — Z113 Encounter for screening for infections with a predominantly sexual mode of transmission: Secondary | ICD-10-CM | POA: Diagnosis not present

## 2018-04-23 DIAGNOSIS — Z1159 Encounter for screening for other viral diseases: Secondary | ICD-10-CM | POA: Diagnosis not present

## 2018-05-20 ENCOUNTER — Ambulatory Visit: Payer: BLUE CROSS/BLUE SHIELD | Admitting: Primary Care

## 2018-05-20 ENCOUNTER — Encounter: Payer: Self-pay | Admitting: Primary Care

## 2018-05-20 VITALS — BP 152/96 | HR 79 | Temp 98.3°F | Ht 63.0 in | Wt 156.5 lb

## 2018-05-20 DIAGNOSIS — R8290 Unspecified abnormal findings in urine: Secondary | ICD-10-CM | POA: Diagnosis not present

## 2018-05-20 DIAGNOSIS — R300 Dysuria: Secondary | ICD-10-CM | POA: Diagnosis not present

## 2018-05-20 DIAGNOSIS — I10 Essential (primary) hypertension: Secondary | ICD-10-CM | POA: Diagnosis not present

## 2018-05-20 DIAGNOSIS — G43909 Migraine, unspecified, not intractable, without status migrainosus: Secondary | ICD-10-CM | POA: Insufficient documentation

## 2018-05-20 DIAGNOSIS — G43709 Chronic migraine without aura, not intractable, without status migrainosus: Secondary | ICD-10-CM | POA: Diagnosis not present

## 2018-05-20 LAB — POC URINALSYSI DIPSTICK (AUTOMATED)
Bilirubin, UA: NEGATIVE
Glucose, UA: NEGATIVE
Ketones, UA: NEGATIVE
Leukocytes, UA: NEGATIVE
Nitrite, UA: NEGATIVE
PH UA: 7.5 (ref 5.0–8.0)
Protein, UA: NEGATIVE
RBC UA: NEGATIVE
Spec Grav, UA: 1.015 (ref 1.010–1.025)
UROBILINOGEN UA: 0.2 U/dL

## 2018-05-20 MED ORDER — HYDROCHLOROTHIAZIDE 25 MG PO TABS: 25.0000 mg | ORAL_TABLET | Freq: Every day | ORAL | 0 refills | Status: DC

## 2018-05-20 MED ORDER — AMLODIPINE BESYLATE 5 MG PO TABS: 5.0000 mg | ORAL_TABLET | Freq: Every day | ORAL | 0 refills | Status: DC

## 2018-05-20 NOTE — Assessment & Plan Note (Signed)
Chronic, originally diagnosed in 2015-2016. No longer requires preventative treatment, migraines are infrequent and overall tolerable. Continue Tylenol and conservative treatment for migraines at this time. She will update if migraines persist.

## 2018-05-20 NOTE — Patient Instructions (Addendum)
Start amlodipine 5 mg once daily for high blood pressure. Continue hydrochlorothiazide 25 mg for high blood pressure. Stop metoprolol succinate 100 mg.  You will be contacted regarding your referral to Urology.  Please let us know if you have not been contacted within one week.   Stop by the lab prior to leaving today. I will notify you of your results once received.   Start monitoring your blood pressure daily, around the same time of day, for the next 2-3weeks.  Ensure that you have rested for 30 minutes prior to checking your blood pressure. Record your readings and bring them to your next visit.  Schedule a follow up visit for 2-3 weeks for BP check.   It was a pleasure to meet you today! Please don't hesitate to call or message me with any questions. Welcome to [facility]!

## 2018-05-20 NOTE — Assessment & Plan Note (Signed)
Chronic since hysterectomy in late 2018. Has been treated twice in the last 2 months for "UTI". Urinalysis today unremarkable. Will send to urology for further evaluation as she has already seen GYN.

## 2018-05-20 NOTE — Progress Notes (Signed)
Subjective:    Patient ID: April Bowen, female    DOB: [birth year], 33 y.o.   MRN: [id number]  HPI  April Bowen is a 33 year old female who presents today to establish care and discuss the problems mentioned below. Will obtain old records.  1) Essential Hypertension: Diagnosed with post partum hypertension in late 2018. Underwent emergency hysterectomy after delivery due to uncontrolled bleeding and lack of fundal contraction.  Currently managed on HCTZ 25 mg, metoprolol succinate 100 mg daily. She is compliant to both medications on a daily basis, has not had her medications today.   She checks her BP at home which runs 140-150's/80's. She has a family history of hypertension in both parents.   BP Readings from Last 3 Encounters:  05/20/18 (!) 152/96  01/17/14 102/59  07/09/12 129/81   2) Migraines: Diagnosed around 2015-2016. She was once managed on Topamax for which she took daily for a while. She stopped taking this during IVF for pregnancy. She will experience migraines 1-2 times monthly, will take Tylenol with dissipation. She will experience photophobia and nausea with migraines.  Migraines are overall tolerable.  3) Urinary Problem: Symptoms of foul smelling and cloudy urine, also dysuria.  Some difficulty with urinating and sometimes will have difficulty.  Diagnosed with UTI on 03/30/18 at [facility] and provided with [facility] for a five day course. , also diagnosed again per GYN in late January 2020 and was provided with a three day course of Bactrim DS tablets. She endorses being evaluated for vaginal infection at that time and tested negative   Since then she continues to notice foul, cloudy smelling urine and dysuria. She endorses drinking 2-3 bottles of water daily. She denies vaginal discharge, hematuria, and itching. She drinks a lot of sweet tea.  The symptoms began after her hysterectomy in late 2018.  Review of Systems  Constitutional: Negative for fatigue.  Eyes:  Negative for visual disturbance.  Respiratory: Negative for shortness of breath.   Cardiovascular: Negative for chest pain.  Gastrointestinal: Negative for abdominal pain.  Endocrine: Negative for polyuria.  Genitourinary: Positive for difficulty urinating and dysuria. Negative for flank pain, frequency, hematuria, urgency and vaginal discharge.       Foul-smelling urine  Neurological: Negative for dizziness and headaches.       History of migraines  Hematological: Negative for adenopathy.  Psychiatric/Behavioral:       Denies concerns for depression at this time.  Prior history of depression postpartum, never treated.       Past Medical History:  Diagnosis Date  . Depression   . Ectopic pregnancy   . Migraines   . Ovarian cyst   . PCOS (polycystic ovarian syndrome)      Social History   Socioeconomic History  . Marital status: Single    Spouse name: Not on file  . Number of children: Not on file  . Years of education: Not on file  . Highest education level: Not on file  Occupational History  . Not on file  Social Needs  . Financial resource strain: Not on file  . Food insecurity:    Worry: Not on file    Inability: Not on file  . Transportation needs:    Medical: Not on file    Non-medical: Not on file  Tobacco Use  . Smoking status: Never Smoker  . Smokeless tobacco: Never Used  Substance and Sexual Activity  . Alcohol use: No  . Drug use: No  .  Sexual activity: Yes  Lifestyle  . Physical activity:    Days per week: Not on file    Minutes per session: Not on file  . Stress: Not on file  Relationships  . Social connections:    Talks on phone: Not on file    Gets together: Not on file    Attends religious service: Not on file    Active member of club or organization: Not on file    Attends meetings of clubs or organizations: Not on file    Relationship status: Not on file  . Intimate partner violence:    Fear of current or ex partner: Not on file     Emotionally abused: Not on file    Physically abused: Not on file    Forced sexual activity: Not on file  Other Topics Concern  . Not on file  Social History Narrative   Moved from [place], from [place].   Works with [employer].   Single.   1 child.     Past Surgical History:  Procedure Laterality Date  . ABDOMINAL HYSTERECTOMY  2018   one ovary remaining  . WISDOM TOOTH EXTRACTION      Family History  Problem Relation Age of Onset  . Hypertension Mother   . Arthritis Mother   . Hypercholesterolemia Mother   . Hypertension Father   . Arthritis Father   . Hypercholesterolemia Father   . Arthritis Maternal Grandmother   . Bone cancer Paternal Grandmother     No Known Allergies  No current outpatient medications on file prior to visit.   No current facility-administered medications on file prior to visit.     BP (!) 152/96   Pulse 79   Temp 98.3 F (36.8 C) (Oral)   Ht 5\' 3"  (1.6 m)   Wt 156 lb 8 oz (71 kg)   LMP 12/18/2013   SpO2 98%   BMI 27.72 kg/m    Objective:   Physical Exam  Constitutional: She is oriented to person, place, and time. She appears well-nourished.  Neck: Neck supple.  Cardiovascular: Normal rate and regular rhythm.  Respiratory: Effort normal and breath sounds normal.  GI: Soft. There is no abdominal tenderness. There is no CVA tenderness.  Neurological: She is alert and oriented to person, place, and time.  Skin: Skin is warm and dry.  Psychiatric: She has a normal mood and affect.           Assessment & Plan:

## 2018-05-20 NOTE — Assessment & Plan Note (Signed)
Diagnosed since postpartum in late 2018. Not well controlled on current regimen, doubt that metoprolol succinate is providing much treatment for her. Stop metoprolol succinate, continue hydrochlorothiazide 25 mg, add amlodipine 5 mg. Check labs today. We will see her back in the office in 2 to 3 weeks for blood pressure check.

## 2018-05-21 LAB — COMPREHENSIVE METABOLIC PANEL
ALBUMIN: 4.2 g/dL (ref 3.5–5.2)
ALT: 12 U/L (ref 0–35)
AST: 14 U/L (ref 0–37)
Alkaline Phosphatase: 49 U/L (ref 39–117)
BUN: 11 mg/dL (ref 6–23)
CHLORIDE: 105 meq/L (ref 96–112)
CO2: 27 meq/L (ref 19–32)
Calcium: 9 mg/dL (ref 8.4–10.5)
Creatinine, Ser: 0.74 mg/dL (ref 0.40–1.20)
GFR: 109.36 mL/min (ref >60.00)
Glucose, Bld: 87 mg/dL (ref 70–99)
POTASSIUM: 4 meq/L (ref 3.5–5.1)
SODIUM: 138 meq/L (ref 135–145)
Total Bilirubin: 0.9 mg/dL (ref 0.2–1.2)
Total Protein: 7.2 g/dL (ref 6.0–8.3)

## 2018-05-21 LAB — HEMOGLOBIN A1C: HEMOGLOBIN A1C: 5.6 % (ref 4.6–6.5)

## 2018-05-31 ENCOUNTER — Telehealth: Payer: Self-pay

## 2018-05-31 NOTE — Telephone Encounter (Signed)
Left message for patient to call back about referral to urology

## 2018-06-07 ENCOUNTER — Telehealth: Payer: Self-pay | Admitting: Primary Care

## 2018-06-07 NOTE — Telephone Encounter (Signed)
Called patient regarding Urology Referral, asked to call [facility] back.

## 2018-06-11 ENCOUNTER — Other Ambulatory Visit: Payer: Self-pay | Admitting: Primary Care

## 2018-06-11 DIAGNOSIS — I10 Essential (primary) hypertension: Secondary | ICD-10-CM

## 2018-06-14 ENCOUNTER — Ambulatory Visit: Payer: BLUE CROSS/BLUE SHIELD | Admitting: Primary Care

## 2018-06-14 ENCOUNTER — Encounter: Payer: Self-pay | Admitting: Primary Care

## 2018-06-14 ENCOUNTER — Other Ambulatory Visit: Payer: Self-pay

## 2018-06-14 VITALS — BP 122/84 | HR 92 | Temp 98.7°F | Ht 63.0 in | Wt 154.2 lb

## 2018-06-14 DIAGNOSIS — F4323 Adjustment disorder with mixed anxiety and depressed mood: Secondary | ICD-10-CM | POA: Diagnosis not present

## 2018-06-14 DIAGNOSIS — I10 Essential (primary) hypertension: Secondary | ICD-10-CM

## 2018-06-14 MED ORDER — AMLODIPINE BESYLATE 5 MG PO TABS: 5.0000 mg | ORAL_TABLET | Freq: Every day | ORAL | 3 refills | Status: DC

## 2018-06-14 MED ORDER — SERTRALINE HCL 25 MG PO TABS: 25.0000 mg | ORAL_TABLET | Freq: Every day | ORAL | 1 refills | Status: DC

## 2018-06-14 NOTE — Assessment & Plan Note (Signed)
Improved on hydrochlorothiazide 25 mg and amlodipine 5 mg.  No side effects.  Continue same.

## 2018-06-14 NOTE — Patient Instructions (Signed)
Continue taking Amlodipine 5 mg and hydrochlorothiazide 25 mg daily for blood pressure.  Start sertraline (Zoloft) 25 mg for anxiety. Start by taking 1/2 tablet for 6 days, then increased to 1 full tablet thereafter.  Schedule a follow up visit for 6 weeks for re-evaluation of anxiety and depression.  It was a pleasure to see you today!

## 2018-06-14 NOTE — Assessment & Plan Note (Signed)
Chronic since postpartum in 2018. GAD 7 score of 15 and PHQ 9 score 14 today.  Discussed different options for treatment including therapy versus medication, she agrees to both.  Referral placed for therapy.  Prescription for Zoloft 25 mg sent to pharmacy.  Patient is to take 1/2 tablet daily for 6 days, then advance to 1 full tablet thereafter. We discussed possible side effects of headache, GI upset, drowsiness, and SI/HI. If thoughts of SI/HI develop, we discussed to present to the emergency immediately. Patient verbalized understanding.   Follow up in 6 weeks for re-evaluation.

## 2018-06-14 NOTE — Progress Notes (Signed)
Subjective:    Patient ID: April Bowen, female    DOB: [birth year], 33 y.o.   MRN: [id number]  HPI  April Bowen is a 33 year old female who presents today for follow up of hypertension.  She would also like to discuss symptoms of anxiety and depression.  1) Hypertension: She was last evaluated on 05/20/18 as a new patient with uncontrolled hypertension on metoprolol succinate 100 mg and HCTZ 25 mg. Home BP readings were 140-150/80's. Given these readings we discontinued her metoprolol succinate 100 mg, continue hydrochlorothiazide 25 mg, and added in Amlodipine 5 mg. She was asked to return today.  Since her last visit she's doing well. She's checking her BP at home which is running 110's-120's/80's. She denies ankle edema.   BP Readings from Last 3 Encounters:  06/14/18 122/84  05/20/18 (!) 152/96  01/17/14 102/59   2) Anxiety and Depression: She thinks she may suffer from anxiety and depression which began post partum in 2018. Symptoms include intermittent palpitations, irritability, feeling anxious, feeling depressed. She has never ben treated for this in the past but her gynecologist did recommend Zoloft at one point.  GAD 7 score of 15 and PHQ 9 score of 14 today. She denies SI/HI.   Review of Systems  Eyes: Negative for visual disturbance.  Respiratory: Negative for shortness of breath.   Cardiovascular: Negative for chest pain and leg swelling.  Neurological: Negative for headaches.  Psychiatric/Behavioral:       See HPI       Past Medical History:  Diagnosis Date  . Depression   . Ectopic pregnancy   . Ectopic pregnancy, tubal 10/31/2010  . Migraines   . Ovarian cyst   . PCOS (polycystic ovarian syndrome)      Social History   Socioeconomic History  . Marital status: Single    Spouse name: Not on file  . Number of children: Not on file  . Years of education: Not on file  . Highest education level: Not on file  Occupational History  . Not on file  Social  Needs  . Financial resource strain: Not on file  . Food insecurity:    Worry: Not on file    Inability: Not on file  . Transportation needs:    Medical: Not on file    Non-medical: Not on file  Tobacco Use  . Smoking status: Never Smoker  . Smokeless tobacco: Never Used  Substance and Sexual Activity  . Alcohol use: No  . Drug use: No  . Sexual activity: Yes  Lifestyle  . Physical activity:    Days per week: Not on file    Minutes per session: Not on file  . Stress: Not on file  Relationships  . Social connections:    Talks on phone: Not on file    Gets together: Not on file    Attends religious service: Not on file    Active member of club or organization: Not on file    Attends meetings of clubs or organizations: Not on file    Relationship status: Not on file  . Intimate partner violence:    Fear of current or ex partner: Not on file    Emotionally abused: Not on file    Physically abused: Not on file    Forced sexual activity: Not on file  Other Topics Concern  . Not on file  Social History Narrative   Moved from [place], from [place].   Works with [employer].  Single.   1 child.     Past Surgical History:  Procedure Laterality Date  . ABDOMINAL HYSTERECTOMY  2018   one ovary remaining  . WISDOM TOOTH EXTRACTION      Family History  Problem Relation Age of Onset  . Hypertension Mother   . Arthritis Mother   . Hypercholesterolemia Mother   . Hypertension Father   . Arthritis Father   . Hypercholesterolemia Father   . Arthritis Maternal Grandmother   . Bone cancer Paternal Grandmother     No Known Allergies  Current Outpatient Medications on File Prior to Visit  Medication Sig Dispense Refill  . hydrochlorothiazide (HYDRODIURIL) 25 MG tablet Take 1 tablet (25 mg total) by mouth daily. For blood pressure. 90 tablet 0   No current facility-administered medications on file prior to visit.     BP 122/84   Pulse 92   Temp 98.7 F (37.1 C)  (Oral)   Ht 5\' 3"  (1.6 m)   Wt 154 lb 4 oz (70 kg)   LMP 12/18/2013   SpO2 99%   BMI 27.32 kg/m    Objective:   Physical Exam  Constitutional: She appears well-nourished.  Neck: Neck supple.  Cardiovascular: Normal rate and regular rhythm.  Respiratory: Effort normal and breath sounds normal.  Skin: Skin is warm and dry.  Psychiatric: She has a normal mood and affect.           Assessment & Plan:

## 2018-06-30 ENCOUNTER — Telehealth: Payer: BLUE CROSS/BLUE SHIELD | Admitting: Nurse Practitioner

## 2018-06-30 DIAGNOSIS — J358 Other chronic diseases of tonsils and adenoids: Secondary | ICD-10-CM

## 2018-06-30 NOTE — Progress Notes (Signed)
Sorry you are not feeling well.   What you are looking out is not pus. It is called a tonsil stone . This is food particles that get caught in the craters in your tomsils. They can smell pretty bad and can get quite large. You can scrape them oof with your finger or use a water pick. There is nothing you can do to prevent them. Drinking lots of liquids can help. Feel fre to look them up on the internet for more information.   Make sure you  Understand these instructions.  Will watch your condition.  Will get help right away if you are not doing well or get worse.  Your e-visit answers were reviewed by a board certified advanced clinical practitioner to complete your personal care plan.  Depending on the condition, your plan could have included both over the counter or prescription medications.  If there is a problem please reply  once you have received a response from your provider.  Your safety is important to [place].  If you have drug allergies check your prescription carefully.    You can use MyChart to ask questions about today's visit, request a non-urgent call back, or ask for a work or school excuse for 24 hours related to this e-Visit. If it has been greater than 24 hours you will need to follow up with your provider, or enter a new e-Visit to address those concerns.  You will get an e-mail in the next two days asking about your experience.  I hope that your e-visit has been valuable and will speed your recovery. Thank you for using e-visits.   5 minutes spent reviewing and documenting in chart.

## 2018-07-09 ENCOUNTER — Other Ambulatory Visit: Payer: Self-pay | Admitting: Primary Care

## 2018-07-09 DIAGNOSIS — F4323 Adjustment disorder with mixed anxiety and depressed mood: Secondary | ICD-10-CM

## 2018-07-17 ENCOUNTER — Ambulatory Visit: Payer: Self-pay | Admitting: Urology

## 2018-07-22 ENCOUNTER — Ambulatory Visit: Payer: Self-pay | Admitting: Urology

## 2018-07-26 ENCOUNTER — Ambulatory Visit (INDEPENDENT_AMBULATORY_CARE_PROVIDER_SITE_OTHER): Payer: BLUE CROSS/BLUE SHIELD | Admitting: Primary Care

## 2018-07-26 ENCOUNTER — Encounter: Payer: Self-pay | Admitting: Primary Care

## 2018-07-26 ENCOUNTER — Other Ambulatory Visit: Payer: Self-pay

## 2018-07-26 DIAGNOSIS — R8290 Unspecified abnormal findings in urine: Secondary | ICD-10-CM

## 2018-07-26 DIAGNOSIS — I10 Essential (primary) hypertension: Secondary | ICD-10-CM | POA: Diagnosis not present

## 2018-07-26 DIAGNOSIS — F4323 Adjustment disorder with mixed anxiety and depressed mood: Secondary | ICD-10-CM

## 2018-07-26 MED ORDER — SERTRALINE HCL 25 MG PO TABS: 25.0000 mg | ORAL_TABLET | Freq: Every day | ORAL | 1 refills | Status: DC

## 2018-07-26 NOTE — Assessment & Plan Note (Signed)
Improved on Zoloft 25 mg and appears to be at a therapeutic dose. Denies SI/HI or any negative side effects. Continue same, refills sent to pharmacy.

## 2018-07-26 NOTE — Patient Instructions (Signed)
Continue Zoloft 25 mg daily for anxiety. I will send refills to your pharmacy.  It was a pleasure to see you today! [name], NP-C

## 2018-07-26 NOTE — Assessment & Plan Note (Signed)
Stable  Continue current regimen  

## 2018-07-26 NOTE — Progress Notes (Signed)
Subjective:    Patient ID: April Bowen, female    DOB: [birth year], 33 y.o.   MRN: [id number]  HPI  Virtual Visit via Video Note  I connected with April Bowen on 07/26/18 at  3:00 PM EDT by a video enabled telemedicine application and verified that I am speaking with the correct person using two identifiers.   I discussed the limitations of evaluation and management by telemedicine and the availability of in person appointments. The patient expressed understanding and agreed to proceed. She is at home, I am in the office.  History of Present Illness:  April Bowen is a 33 year old female who presents today for follow up of anxiety and depression.  She was last evaluated on 06/14/18 and endorsed chronic symptoms since postpartum in 2018. GAD 7 score of 15 and PHQ 9 score of 14 so she was referred to therapy and initiated on Zoloft 25 mg.   Today she endorses that she's feeling better. She's not as anxious, she is less irritable and doesn't get upset as easy as she once did. She has more patience. Her family has noticed a significant difference. She denies SI/HI, headaches, GI upset.    Observations/Objective:  Appears well. Appears more relaxed and less anxious. Speaking in complete sentences. Smiling.  Assessment and Plan:  See problem based charting.  Follow Up Instructions:  Continue Zoloft 25 mg daily for anxiety. I will send refills to your pharmacy.  It was a pleasure to see you today! [name], NP-C   I discussed the assessment and treatment plan with the patient. The patient was provided an opportunity to ask questions and all were answered. The patient agreed with the plan and demonstrated an understanding of the instructions.   The patient was advised to call back or seek an in-person evaluation if the symptoms worsen or if the condition fails to improve as anticipated.     Doreene Nest, NP    Review of Systems  Gastrointestinal: Negative  for abdominal pain, nausea and vomiting.  Neurological: Negative for dizziness and headaches.  Psychiatric/Behavioral: Negative for suicidal ideas.       See HPI       Past Medical History:  Diagnosis Date  . Depression   . Ectopic pregnancy   . Ectopic pregnancy, tubal 10/31/2010  . Migraines   . Ovarian cyst   . PCOS (polycystic ovarian syndrome)      Social History   Socioeconomic History  . Marital status: Single    Spouse name: Not on file  . Number of children: Not on file  . Years of education: Not on file  . Highest education level: Not on file  Occupational History  . Not on file  Social Needs  . Financial resource strain: Not on file  . Food insecurity:    Worry: Not on file    Inability: Not on file  . Transportation needs:    Medical: Not on file    Non-medical: Not on file  Tobacco Use  . Smoking status: Never Smoker  . Smokeless tobacco: Never Used  Substance and Sexual Activity  . Alcohol use: No  . Drug use: No  . Sexual activity: Yes  Lifestyle  . Physical activity:    Days per week: Not on file    Minutes per session: Not on file  . Stress: Not on file  Relationships  . Social connections:    Talks on phone: Not on file  Gets together: Not on file    Attends religious service: Not on file    Active member of club or organization: Not on file    Attends meetings of clubs or organizations: Not on file    Relationship status: Not on file  . Intimate partner violence:    Fear of current or ex partner: Not on file    Emotionally abused: Not on file    Physically abused: Not on file    Forced sexual activity: Not on file  Other Topics Concern  . Not on file  Social History Narrative   [place], from [place].   Works with [employer].   Single.   1 child.     Past Surgical History:  Procedure Laterality Date  . ABDOMINAL HYSTERECTOMY  2018   one ovary remaining  . WISDOM TOOTH EXTRACTION      Family History  Problem  Relation Age of Onset  . Hypertension Mother   . Arthritis Mother   . Hypercholesterolemia Mother   . Hypertension Father   . Arthritis Father   . Hypercholesterolemia Father   . Arthritis Maternal Grandmother   . Bone cancer Paternal Grandmother     No Known Allergies  Current Outpatient Medications on File Prior to Visit  Medication Sig Dispense Refill  . amLODipine (NORVASC) 5 MG tablet Take 1 tablet (5 mg total) by mouth daily. For blood pressure. 90 tablet 3  . hydrochlorothiazide (HYDRODIURIL) 25 MG tablet Take 1 tablet (25 mg total) by mouth daily. For blood pressure. 90 tablet 0   No current facility-administered medications on file prior to visit.     BP 117/82   LMP 12/18/2013    Objective:   Physical Exam  Constitutional: She is oriented to person, place, and time. She appears well-nourished.  Respiratory: Effort normal.  Neurological: She is alert and oriented to person, place, and time.  Psychiatric: She has a normal mood and affect.           Assessment & Plan:

## 2018-07-26 NOTE — Assessment & Plan Note (Signed)
Will be seeing urology in July 2020. Discussed to also notify GYN of her symptoms.

## 2018-08-21 ENCOUNTER — Ambulatory Visit: Payer: BLUE CROSS/BLUE SHIELD | Admitting: Psychology

## 2018-09-10 ENCOUNTER — Other Ambulatory Visit: Payer: Self-pay | Admitting: Primary Care

## 2018-09-10 DIAGNOSIS — I10 Essential (primary) hypertension: Secondary | ICD-10-CM

## 2018-09-11 ENCOUNTER — Telehealth: Payer: Self-pay | Admitting: Urology

## 2018-09-11 NOTE — Telephone Encounter (Signed)
Pt called and wants to know if she can be seen sooner than 10/15/2018, she states that she is still having foul odor in her urine and also burning with urination. She also states that her abdomen hurts all the time.Please advise

## 2018-09-12 NOTE — Telephone Encounter (Signed)
Patient will be a new patient. Are you able to see her sooner? Maybe on a Virtual day?

## 2018-09-13 NOTE — Telephone Encounter (Signed)
Appointment was moved to a sooner date of 09/25/18 at 11:30am.  We will continue to keep this patient on the wait list if we have any cancellations.  Patient agreed to new appt date.

## 2018-09-20 DIAGNOSIS — L659 Nonscarring hair loss, unspecified: Secondary | ICD-10-CM | POA: Diagnosis not present

## 2018-09-20 DIAGNOSIS — E282 Polycystic ovarian syndrome: Secondary | ICD-10-CM | POA: Diagnosis not present

## 2018-09-25 ENCOUNTER — Encounter: Payer: Self-pay | Admitting: Urology

## 2018-09-25 ENCOUNTER — Ambulatory Visit: Payer: BC Managed Care – PPO | Admitting: Urology

## 2018-09-25 ENCOUNTER — Other Ambulatory Visit: Payer: Self-pay

## 2018-09-25 VITALS — BP 117/80 | HR 89 | Ht 63.0 in | Wt 152.6 lb

## 2018-09-25 DIAGNOSIS — N390 Urinary tract infection, site not specified: Secondary | ICD-10-CM | POA: Diagnosis not present

## 2018-09-25 DIAGNOSIS — R300 Dysuria: Secondary | ICD-10-CM | POA: Diagnosis not present

## 2018-09-25 LAB — MICROSCOPIC EXAMINATION: RBC: NONE SEEN /hpf (ref 0–2)

## 2018-09-25 LAB — URINALYSIS, COMPLETE
Bilirubin, UA: NEGATIVE
Glucose, UA: NEGATIVE
Ketones, UA: NEGATIVE
Nitrite, UA: NEGATIVE
Protein,UA: NEGATIVE
RBC, UA: NEGATIVE
Specific Gravity, UA: 1.025 (ref 1.005–1.030)
Urobilinogen, Ur: 0.2 mg/dL (ref 0.2–1.0)
pH, UA: 6 (ref 5.0–7.5)

## 2018-09-25 LAB — BLADDER SCAN AMB NON-IMAGING

## 2018-09-25 MED ORDER — URIBEL 118 MG PO CAPS: 1.0000 | ORAL_CAPSULE | Freq: Four times a day (QID) | ORAL | 2 refills | Status: DC | PRN

## 2018-09-25 NOTE — Patient Instructions (Signed)
Uribel for buring with urination  Recommend probiotic daily and cranberry tablets for UTI prevention  If your symptoms don't improve, call and schedule cystoscopy

## 2018-09-25 NOTE — Progress Notes (Signed)
43/[id number]/9518 8:41 PM   April Bowen [birth year] [id number]  Referring provider: Pleas Koch, NP [place],  [place] [address]  Chief Complaint  Patient presents with  . Dysuria    HPI: 33 year old female who presents today for further evaluation of recurrent urinary tract infections and dysuria.  Notably over the past year, she has been seen on several occasions with dysuria found to have a UTI.  She was [facility]30/2018 with a suspicious urine and ultimately grew E. Coli which was pansensitive.  She is also supposedly treated in January with a UTI although I do not see any documentation of this.  She reports intermittent episodes of dysuria.  They do not seem to be exacerbated by alleviated by any particular behavioral, frequent beverage.  She also notes that occasion, her urine is cloudy and sometimes has a different odor.  She in fact has a mild dysuria today.  Resolves after urination.  She reports that she consumes mostly in her urethra.  Started primarily after emergency hysterectomy after childbirth in 2018.  She does still have her ovaries.  No hot flashes.  It appears that she does have a history of recurrent UTIs.  She had a CT scan back in 2013 for further evaluation of this.  She is some punctate stones in the time, otherwise is unremarkable.  She denies pain with intercourse.  She drinks 1 to 2 cups of coffee in the morning.  During the day she drinks primarily water.  She drinks a soda once every couple days if she has 1.  She does that she has been struggling with anxiety and depression.  She reports that she has been really anxious about her urinary symptoms.   PMH: Past Medical History:  Diagnosis Date  . Depression   . Ectopic pregnancy   . Ectopic pregnancy, tubal 10/31/2010  . Migraines   . Ovarian cyst   . PCOS (polycystic ovarian syndrome)     Surgical History: Past Surgical History:  Procedure Laterality Date   . ABDOMINAL HYSTERECTOMY  2018   one ovary remaining  . WISDOM TOOTH EXTRACTION      Home Medications:  Allergies as of 09/25/2018   No Known Allergies     Medication List       Accurate as of September 25, 2018 11:59 PM. If you have any questions, ask your nurse or doctor.        amLODipine 5 MG tablet Commonly known as: NORVASC Take 1 tablet (5 mg total) by mouth daily. For blood pressure.   hydrochlorothiazide 25 MG tablet Commonly known as: HYDRODIURIL TAKE 1 TABLET BY MOUTH EVERY DAY FOR BLOOD PRESSURE   sertraline 25 MG tablet Commonly known as: Zoloft Take 1 tablet (25 mg total) by mouth daily. For anxiety and depression.   Uribel 118 MG Caps Take 1 capsule (118 mg total) by mouth 4 (four) times daily as needed. Started by: Hollice Espy, MD       Allergies: No Known Allergies  Family History: Family History  Problem Relation Age of Onset  . Hypertension Mother   . Arthritis Mother   . Hypercholesterolemia Mother   . Hypertension Father   . Arthritis Father   . Hypercholesterolemia Father   . Arthritis Maternal Grandmother   . Bone cancer Paternal Grandmother     Social History:  reports that she has never smoked. She has never used smokeless tobacco. She reports that she does  not drink alcohol or use drugs.  ROS: UROLOGY Frequent Urination?: No Hard to postpone urination?: No Burning/pain with urination?: Yes Get up at night to urinate?: No Leakage of urine?: Yes Urine stream starts and stops?: No Trouble starting stream?: No Do you have to strain to urinate?: No Blood in urine?: No Urinary tract infection?: No Sexually transmitted disease?: No Injury to kidneys or bladder?: No Painful intercourse?: No Weak stream?: No Currently pregnant?: No Vaginal bleeding?: Yes Last menstrual period?: n  Gastrointestinal Nausea?: No Vomiting?: No Indigestion/heartburn?: No Diarrhea?: No Constipation?: No  Constitutional Fever: No Night  sweats?: No Weight loss?: No Fatigue?: No  Skin Skin rash/lesions?: No Itching?: No  Eyes Blurred vision?: No Double vision?: No  Ears/Nose/Throat Sore throat?: No Sinus problems?: No  Hematologic/Lymphatic Swollen glands?: No Easy bruising?: No  Cardiovascular Leg swelling?: No Chest pain?: No  Respiratory Cough?: No Shortness of breath?: No  Endocrine Excessive thirst?: No  Musculoskeletal Back pain?: No Joint pain?: No  Neurological Headaches?: Yes Dizziness?: No  Psychologic Depression?: No Anxiety?: Yes  Physical Exam: BP 117/80 (BP Location: Left Arm, Patient Position: Sitting, Cuff Size: Normal)   Pulse 89   Ht 5\' 3"  (1.6 m)   Wt 152 lb 9.6 oz (69.2 kg)   LMP 12/18/2013   BMI 27.03 kg/m   Constitutional:  Alert and oriented, No acute distress. HEENT: [place] AT, moist mucus membranes.  Trachea midline, no masses. Cardiovascular: No clubbing, cyanosis, or edema. Respiratory: Normal respiratory effort, no increased work of breathing. GI: Abdomen is soft, nontender, nondistended, no abdominal masses GU: Normal urethral meatus. Pelvic: Chaperoned by PA Sam Viallancourt and MA.  Normal external genitalia.  Healthy vaginal mucosa with excellent support, no prolapse identified.  Normal urethral meatus without masses or lesions.  Bimanual exam revealed no bladder tenderness or vaginal vault tenderness.  No vulvodynia. Skin: No rashes, bruises or suspicious lesions. Neurologic: Grossly intact, no focal deficits, moving all 4 extremities. Psychiatric: Normal mood and affect.  Laboratory Data: Lab Results  Component Value Date   WBC 11.3 (H) 01/17/2014   HGB 12.9 01/17/2014   HCT 38.0 01/17/2014   MCV 91.6 01/17/2014   PLT 132 (L) 01/17/2014    Lab Results  Component Value Date   CREATININE 0.74 05/20/2018     Lab Results  Component Value Date   HGBA1C 5.6 05/20/2018    Urinalysis Results for orders placed or performed in visit on 09/25/18   Microscopic Examination   URINE  Result Value Ref Range   WBC, UA 0-5 0 - 5 /hpf   RBC None seen 0 - 2 /hpf   Epithelial Cells (non renal) 0-10 0 - 10 /hpf   Mucus, UA Present (A) Not Estab.   Bacteria, UA Many (A) None seen/Few  Urinalysis, Complete  Result Value Ref Range   Specific Gravity, UA 1.025 1.005 - 1.030   pH, UA 6.0 5.0 - 7.5   Color, UA Yellow Yellow   Appearance Ur Cloudy (A) Clear   Leukocytes,UA 1+ (A) Negative   Protein,UA Negative Negative/Trace   Glucose, UA Negative Negative   Ketones, UA Negative Negative   RBC, UA Negative Negative   Bilirubin, UA Negative Negative   Urobilinogen, Ur 0.2 0.2 - 1.0 mg/dL   Nitrite, UA Negative Negative   Microscopic Examination See below:   Bladder Scan (Post Void Residual) in office  Result Value Ref Range   Scan Result 3ml     Pertinent Imaging: PVR as above  Assessment &  Plan:    1. Dysuria No evidence of UTI or incomplete bladder emptying.  Pelvic exam today is unremarkable which is reassuring  I do believe that she did in fact have a true urinary tract infection although currently does not have one.  Given her age and lack of comorbidities, I do not suspect any underlying significant pathology including no real concern for bladder cancer.  We did discuss routine UTI prevention including hygiene issues, addition of probiotic and cranberry tablets which may help with UTI prevention.  She is advised to come here if she has significant dysuria for UA/urine culture to rule out infection.  I prescribed her Uribel to take as needed to help with her dysuria symptoms.  If her symptoms fail to improve with this medication above interventions including behavioral modification, would like her to come back for cystoscopy to rule out any underlying bladder pathology although not suspected.  Lastly, she brought up that her anxiety may be contributing to fixation her urinary symptoms.  I believe this is likely and she will  work on lowering her stress level/anxiety. - Urinalysis, Complete   Vanna ScotlandAshley Moishy Laday, MD  [facility] [address], [address] [place], [place] [address] [phone]  I spent 45 min with this patient of which greater than 50% was spent in counseling and coordination of care with the patient.

## 2018-09-28 LAB — CULTURE, URINE COMPREHENSIVE

## 2018-09-30 ENCOUNTER — Telehealth: Payer: Self-pay

## 2018-09-30 MED ORDER — SULFAMETHOXAZOLE-TRIMETHOPRIM 800-160 MG PO TABS: 1.0000 | ORAL_TABLET | Freq: Two times a day (BID) | ORAL | 0 refills | Status: AC

## 2018-09-30 MED ORDER — TRIMETHOPRIM 100 MG PO TABS: 100.0000 mg | ORAL_TABLET | Freq: Every day | ORAL | 0 refills | Status: DC

## 2018-09-30 NOTE — Telephone Encounter (Signed)
Called and advised patient of results and new Rxs. Bactrim DS and Trimethoprim sent to [facility]. Patient verbalized understanding and will advise if symptoms return.

## 2018-09-30 NOTE — Telephone Encounter (Signed)
-----   Message from Hollice Espy, MD sent at 09/29/2018  2:41 PM EDT ----- Even though her urine did not appear to be particularly infected, she ended up growing E. coli in her urine.  This can treat this with Bactrim DS twice daily for 7 days.  Since she is had this recurring issue, lets also then follow this by treating with suppressive antibiotics trimethoprim 100 mg daily for 90 days.  If her symptoms recur, like her to come back for cystoscopy.  Hollice Espy, MD

## 2018-10-15 ENCOUNTER — Ambulatory Visit: Payer: Self-pay | Admitting: Urology

## 2019-03-24 DIAGNOSIS — Z20828 Contact with and (suspected) exposure to other viral communicable diseases: Secondary | ICD-10-CM | POA: Diagnosis not present

## 2019-03-26 ENCOUNTER — Other Ambulatory Visit: Payer: Self-pay | Admitting: Urology

## 2019-03-31 ENCOUNTER — Other Ambulatory Visit: Payer: Self-pay

## 2019-03-31 DIAGNOSIS — I10 Essential (primary) hypertension: Secondary | ICD-10-CM

## 2019-03-31 MED ORDER — HYDROCHLOROTHIAZIDE 25 MG PO TABS: ORAL_TABLET | ORAL | 0 refills | Status: DC

## 2019-04-05 ENCOUNTER — Other Ambulatory Visit: Payer: Self-pay

## 2019-04-05 ENCOUNTER — Emergency Department
Admission: EM | Admit: 2019-04-05 | Discharge: 2019-04-05 | Disposition: A | Discharge status: Home / Self Care | Payer: BC Managed Care – PPO | Attending: Emergency Medicine | Admitting: Emergency Medicine

## 2019-04-05 ENCOUNTER — Encounter: Payer: Self-pay | Admitting: Emergency Medicine

## 2019-04-05 DIAGNOSIS — U071 COVID-19: Secondary | ICD-10-CM | POA: Insufficient documentation

## 2019-04-05 DIAGNOSIS — Z79899 Other long term (current) drug therapy: Secondary | ICD-10-CM | POA: Insufficient documentation

## 2019-04-05 DIAGNOSIS — I10 Essential (primary) hypertension: Secondary | ICD-10-CM | POA: Diagnosis not present

## 2019-04-05 DIAGNOSIS — B349 Viral infection, unspecified: Secondary | ICD-10-CM

## 2019-04-05 DIAGNOSIS — R5383 Other fatigue: Secondary | ICD-10-CM | POA: Diagnosis not present

## 2019-04-05 DIAGNOSIS — Z20822 Contact with and (suspected) exposure to covid-19: Secondary | ICD-10-CM

## 2019-04-05 MED ORDER — KETOROLAC TROMETHAMINE 30 MG/ML IJ SOLN
30.0000 mg | Freq: Once | INTRAMUSCULAR | Status: AC
  Administered 2019-04-05: 20:00:00 30 mg via INTRAMUSCULAR
  Filled 2019-04-05: qty 1

## 2019-04-05 NOTE — ED Triage Notes (Signed)
Fatigue, body aches and lost of taste and smell x 2 days.

## 2019-04-05 NOTE — ED Provider Notes (Signed)
[facility] Emergency Department Provider Note  ____________________________________________  Time seen: Approximately 7:44 PM  I have reviewed the triage vital signs and the nursing notes.   HISTORY  Chief Complaint Fatigue and Generalized Body Aches    HPI April Bowen is a 34 y.o. female that presents to the emergency department for evaluation of fatigue, headache, neck pain, nasal congestion, shortness of breath for about 1 week.  Patient states that 3 days ago she lost her sense of taste and smell.  Patient states that she cannot even smell her 10-year-old son's diaper.  She cannot taste her mother's potato soup.  She was taking Alka-Seltzer which helped with the sinus congestion.  She was flat ironing her hair today and had to take a break from the SOB.  Patient does not smoke.  No vomiting, diarrhea.   Past Medical History:  Diagnosis Date  . Depression   . Ectopic pregnancy   . Ectopic pregnancy, tubal 10/31/2010  . Migraines   . Ovarian cyst   . PCOS (polycystic ovarian syndrome)     Patient Active Problem List   Diagnosis Date Noted  . Adjustment disorder with mixed anxiety and depressed mood 06/14/2018  . Essential hypertension 05/20/2018  . Foul smelling urine 05/20/2018  . Migraines 05/20/2018    Past Surgical History:  Procedure Laterality Date  . ABDOMINAL HYSTERECTOMY  2018   one ovary remaining  . WISDOM TOOTH EXTRACTION      Prior to Admission medications   Medication Sig Start Date End Date Taking? Authorizing Provider  amLODipine (NORVASC) 5 MG tablet Take 1 tablet (5 mg total) by mouth daily. For blood pressure. 06/14/18   Doreene Nest, NP  hydrochlorothiazide (HYDRODIURIL) 25 MG tablet TAKE 1 TABLET BY MOUTH EVERY DAY FOR BLOOD PRESSURE 03/31/19   Doreene Nest, NP  Meth-Hyo-M Salley Hews Phos-Ph Sal (URIBEL) 118 MG CAPS Take 1 capsule (118 mg total) by mouth 4 (four) times daily as needed. 09/25/18   Vanna Scotland,  MD  sertraline (ZOLOFT) 25 MG tablet Take 1 tablet (25 mg total) by mouth daily. For anxiety and depression. 07/26/18   Doreene Nest, NP  trimethoprim (TRIMPEX) 100 MG tablet Take 1 tablet (100 mg total) by mouth daily. 09/30/18   Vanna Scotland, MD    Allergies Patient has no known allergies.  Family History  Problem Relation Age of Onset  . Hypertension Mother   . Arthritis Mother   . Hypercholesterolemia Mother   . Hypertension Father   . Arthritis Father   . Hypercholesterolemia Father   . Arthritis Maternal Grandmother   . Bone cancer Paternal Grandmother     Social History Social History   Tobacco Use  . Smoking status: Never Smoker  . Smokeless tobacco: Never Used  Substance Use Topics  . Alcohol use: No  . Drug use: No     Review of Systems  Constitutional: No fever/chills Eyes: No visual changes. No discharge. ENT: Positive for congestion and rhinorrhea. Cardiovascular: No chest pain. Respiratory: Positive for cough. Positive for SOB. Gastrointestinal: No abdominal pain.  No nausea, no vomiting.  No diarrhea.  No constipation. Musculoskeletal: Positive for neck pain. Skin: Negative for rash, abrasions, lacerations, ecchymosis. Neurological: Positive for headache.   ____________________________________________   PHYSICAL EXAM:  VITAL SIGNS: ED Triage Vitals  Enc Vitals Group     BP 04/05/19 1709 130/79     Pulse Rate 04/05/19 1709 94     Resp 04/05/19 1709 20  Temp 04/05/19 1709 98.6 F (37 C)     Temp Source 04/05/19 1709 Oral     SpO2 04/05/19 1709 97 %     Weight 04/05/19 1711 152 lb (68.9 kg)     Height 04/05/19 1711 5\' 2"  (1.575 m)     Head Circumference --      Peak Flow --      Pain Score 04/05/19 1711 3     Pain Loc --      Pain Edu? --      Excl. in GC? --      Constitutional: Alert and oriented. Well appearing and in no acute distress. Eyes: Conjunctivae are normal. PERRL. EOMI. No discharge. Head: Atraumatic. ENT: No  frontal and maxillary sinus tenderness.      Ears: Tympanic membranes pearly gray with good landmarks. No discharge.      Nose: Mild congestion/rhinnorhea.      Mouth/Throat: Mucous membranes are moist. Oropharynx non-erythematous. Tonsils not enlarged. No exudates. Uvula midline. Neck: No stridor.  Full ROM of neck without pain. Hematological/Lymphatic/Immunilogical: No cervical lymphadenopathy. Cardiovascular: Normal rate, regular rhythm.  Good peripheral circulation. Respiratory: Normal respiratory effort without tachypnea or retractions. Lungs CTAB. Good air entry to the bases with no decreased or absent breath sounds. Gastrointestinal: Bowel sounds 4 quadrants. Soft and nontender to palpation. No guarding or rigidity. No palpable masses. No distention. Musculoskeletal: Full range of motion to all extremities. No gross deformities appreciated. Neurologic:  Normal speech and language. No gross focal neurologic deficits are appreciated.  Skin:  Skin is warm, dry and intact. No rash noted. Psychiatric: Mood and affect are normal. Speech and behavior are normal. Patient exhibits appropriate insight and judgement.   ____________________________________________   LABS (all labs ordered are listed, but only abnormal results are displayed)  Labs Reviewed  SARS CORONAVIRUS 2 (TAT 6-24 HRS)   ____________________________________________  EKG   ____________________________________________  RADIOLOGY   No results found.  ____________________________________________    PROCEDURES  Procedure(s) performed:    Procedures    Medications  ketorolac (TORADOL) 30 MG/ML injection 30 mg (30 mg Intramuscular Given 04/05/19 2003)     ____________________________________________   INITIAL IMPRESSION / ASSESSMENT AND PLAN / ED COURSE  Pertinent labs & imaging results that were available during my care of the patient were reviewed by me and considered in my medical decision making  (see chart for details).  Review of the [place] CSRS was performed in accordance of the [facility] prior to dispensing any controlled drugs.   Patient's diagnosis is consistent with viral illness. Vital signs and exam are reassuring.  Covid test is pending.  Patient appears well and is staying well hydrated.  Patient declines chest x-ray at this time.  Patient feels comfortable going home. Patient is to follow up with primary care as needed or otherwise directed. Patient is given ED precautions to return to the ED for any worsening or new symptoms.  April Bowen was evaluated in Emergency Department on 04/05/2019 for the symptoms described in the history of present illness. She was evaluated in the context of the global COVID-19 pandemic, which necessitated consideration that the patient might be at risk for infection with the SARS-CoV-2 virus that causes COVID-19. Institutional protocols and algorithms that pertain to the evaluation of patients at risk for COVID-19 are in a state of rapid change based on information released by regulatory bodies including the CDC and federal and state organizations. These policies and algorithms were followed during the patient's care  in the ED.   ____________________________________________  FINAL CLINICAL IMPRESSION(S) / ED DIAGNOSES  Final diagnoses:  Viral illness  Encounter for screening laboratory testing for COVID-19 virus      NEW MEDICATIONS STARTED DURING THIS VISIT:  ED Discharge Orders    None          This chart was dictated using voice recognition software/Dragon. Despite best efforts to proofread, errors can occur which can change the meaning. Any change was purely unintentional.    Laban Emperor, PA-C 04/05/19 2254    Duffy Bruce, MD 04/06/19 2300

## 2019-04-06 LAB — SARS CORONAVIRUS 2 (TAT 6-24 HRS): SARS Coronavirus 2: POSITIVE — AB

## 2019-04-07 ENCOUNTER — Telehealth: Payer: Self-pay | Admitting: Emergency Medicine

## 2019-04-07 NOTE — Telephone Encounter (Signed)
Called to assure patient is aware of positive covid.  Left message.

## 2019-04-28 ENCOUNTER — Other Ambulatory Visit: Payer: Self-pay | Admitting: Primary Care

## 2019-04-28 DIAGNOSIS — F4323 Adjustment disorder with mixed anxiety and depressed mood: Secondary | ICD-10-CM

## 2019-05-19 ENCOUNTER — Ambulatory Visit (INDEPENDENT_AMBULATORY_CARE_PROVIDER_SITE_OTHER): Payer: BC Managed Care – PPO | Admitting: Primary Care

## 2019-05-19 ENCOUNTER — Encounter: Payer: Self-pay | Admitting: Primary Care

## 2019-05-19 ENCOUNTER — Other Ambulatory Visit: Payer: Self-pay

## 2019-05-19 VITALS — BP 118/80 | HR 85 | Temp 96.8°F | Ht 62.0 in | Wt 153.8 lb

## 2019-05-19 DIAGNOSIS — Z0001 Encounter for general adult medical examination with abnormal findings: Secondary | ICD-10-CM | POA: Insufficient documentation

## 2019-05-19 DIAGNOSIS — I10 Essential (primary) hypertension: Secondary | ICD-10-CM | POA: Diagnosis not present

## 2019-05-19 DIAGNOSIS — Z0000 Encounter for general adult medical examination without abnormal findings: Secondary | ICD-10-CM | POA: Insufficient documentation

## 2019-05-19 DIAGNOSIS — F4323 Adjustment disorder with mixed anxiety and depressed mood: Secondary | ICD-10-CM

## 2019-05-19 DIAGNOSIS — G43709 Chronic migraine without aura, not intractable, without status migrainosus: Secondary | ICD-10-CM

## 2019-05-19 DIAGNOSIS — R8290 Unspecified abnormal findings in urine: Secondary | ICD-10-CM | POA: Diagnosis not present

## 2019-05-19 LAB — CBC
HCT: 35.7 % — ABNORMAL LOW (ref 36.0–46.0)
Hemoglobin: 11.9 g/dL — ABNORMAL LOW (ref 12.0–15.0)
MCHC: 33.4 g/dL (ref 30.0–36.0)
MCV: 92.9 fl (ref 78.0–100.0)
Platelets: 180 10*3/uL (ref 150.0–400.0)
RBC: 3.84 Mil/uL — ABNORMAL LOW (ref 3.87–5.11)
RDW: 13.7 % (ref 11.5–15.5)
WBC: 7.5 10*3/uL (ref 4.0–10.5)

## 2019-05-19 LAB — COMPREHENSIVE METABOLIC PANEL
ALT: 8 U/L (ref 0–35)
AST: 10 U/L (ref 0–37)
Albumin: 4 g/dL (ref 3.5–5.2)
Alkaline Phosphatase: 46 U/L (ref 39–117)
BUN: 11 mg/dL (ref 6–23)
CO2: 25 mEq/L (ref 19–32)
Calcium: 8.8 mg/dL (ref 8.4–10.5)
Chloride: 107 mEq/L (ref 96–112)
Creatinine, Ser: 0.77 mg/dL (ref 0.40–1.20)
GFR: 103.83 mL/min (ref >60.00)
Glucose, Bld: 80 mg/dL (ref 70–99)
Potassium: 4.1 mEq/L (ref 3.5–5.1)
Sodium: 138 mEq/L (ref 135–145)
Total Bilirubin: 0.5 mg/dL (ref 0.2–1.2)
Total Protein: 6.5 g/dL (ref 6.0–8.3)

## 2019-05-19 LAB — LIPID PANEL
Cholesterol: 123 mg/dL (ref 0–200)
HDL: 43.4 mg/dL (ref >39.00)
LDL Cholesterol: 57 mg/dL (ref 0–99)
NonHDL: 79.68
Total CHOL/HDL Ratio: 3
Triglycerides: 113 mg/dL (ref 0.0–149.0)
VLDL: 22.6 mg/dL (ref 0.0–40.0)

## 2019-05-19 MED ORDER — SERTRALINE HCL 50 MG PO TABS: 50.0000 mg | ORAL_TABLET | Freq: Every day | ORAL | 3 refills | Status: DC

## 2019-05-19 MED ORDER — SUMATRIPTAN SUCCINATE 50 MG PO TABS: ORAL_TABLET | ORAL | 0 refills | Status: DC

## 2019-05-19 MED ORDER — TOPIRAMATE 50 MG PO TABS: 50.0000 mg | ORAL_TABLET | Freq: Every day | ORAL | 0 refills | Status: DC

## 2019-05-19 NOTE — Assessment & Plan Note (Signed)
Tetanus UTD, declines influenza vaccination. Pap smear UTD, still has part of her cervix from hysterectomy. Also has one ovary.   .Discussed the importance of a healthy diet and regular exercise in order for weight loss, and to reduce the risk of any potential medical problems.  Exam today stable. See HPI. Labs pending.

## 2019-05-19 NOTE — Assessment & Plan Note (Signed)
Chronic, increased over the last several months. Weekly on average.   Rx for Topamax sent to pharmacy for preventative treatment as she did well in the past.  Rx for Imitrex course sent to pharmacy for abortive treatment. Discussed potential side effects.   She will update.

## 2019-05-19 NOTE — Patient Instructions (Signed)
Start Topamax 50 mg at bedtime for migraine prevention.  You can try sumatriptan (Imitrex) 50 mg for migraine abortion. Take 1 tablet by mouth at migraine onset. May repeat in 2 hours if headache persists or recurs.  We've increased the dose of your sertraline (Zoloft) to 50 mg. I sent a new prescription to your pharmacy.  Start exercising. You should be getting 150 minutes of moderate intensity exercise weekly.  It's important to improve your diet by reducing consumption of fast food, fried food, processed snack foods, sugary drinks. Increase consumption of fresh vegetables and fruits, whole grains, water.  Ensure you are drinking 64 ounces of water daily.  Please update me regarding headaches and anxiety as discussed.  It was a pleasure to see you today!   Preventive Care 64-2 Years Old, Female Preventive care refers to visits with your health care provider and lifestyle choices that can promote health and wellness. This includes:  A yearly physical exam. This may also be called an annual well check.  Regular dental visits and eye exams.  Immunizations.  Screening for certain conditions.  Healthy lifestyle choices, such as eating a healthy diet, getting regular exercise, not using drugs or products that contain nicotine and tobacco, and limiting alcohol use. What can I expect for my preventive care visit? Physical exam Your health care provider will check your:  Height and weight. This may be used to calculate body mass index (BMI), which tells if you are at a healthy weight.  Heart rate and blood pressure.  Skin for abnormal spots. Counseling Your health care provider may ask you questions about your:  Alcohol, tobacco, and drug use.  Emotional well-being.  Home and relationship well-being.  Sexual activity.  Eating habits.  Work and work Statistician.  Method of birth control.  Menstrual cycle.  Pregnancy history. What immunizations do I  need?  Influenza (flu) vaccine  This is recommended every year. Tetanus, diphtheria, and pertussis (Tdap) vaccine  You may need a Td booster every 10 years. Varicella (chickenpox) vaccine  You may need this if you have not been vaccinated. Human papillomavirus (HPV) vaccine  If recommended by your health care provider, you may need three doses over 6 months. Measles, mumps, and rubella (MMR) vaccine  You may need at least one dose of MMR. You may also need a second dose. Meningococcal conjugate (MenACWY) vaccine  One dose is recommended if you are age 49-21 years and a first-year college student living in a residence hall, or if you have one of several medical conditions. You may also need additional booster doses. Pneumococcal conjugate (PCV13) vaccine  You may need this if you have certain conditions and were not previously vaccinated. Pneumococcal polysaccharide (PPSV23) vaccine  You may need one or two doses if you smoke cigarettes or if you have certain conditions. Hepatitis A vaccine  You may need this if you have certain conditions or if you travel or work in places where you may be exposed to hepatitis A. Hepatitis B vaccine  You may need this if you have certain conditions or if you travel or work in places where you may be exposed to hepatitis B. Haemophilus influenzae type b (Hib) vaccine  You may need this if you have certain conditions. You may receive vaccines as individual doses or as more than one vaccine together in one shot (combination vaccines). Talk with your health care provider about the risks and benefits of combination vaccines. What tests do I need?  Blood tests  Lipid and cholesterol levels. These may be checked every 5 years starting at age 53.  Hepatitis C test.  Hepatitis B test. Screening  Diabetes screening. This is done by checking your blood sugar (glucose) after you have not eaten for a while (fasting).  Sexually transmitted disease  (STD) testing.  BRCA-related cancer screening. This may be done if you have a family history of breast, ovarian, tubal, or peritoneal cancers.  Pelvic exam and Pap test. This may be done every 3 years starting at age 75. Starting at age 68, this may be done every 5 years if you have a Pap test in combination with an HPV test. Talk with your health care provider about your test results, treatment options, and if necessary, the need for more tests. Follow these instructions at home: Eating and drinking   Eat a diet that includes fresh fruits and vegetables, whole grains, lean protein, and low-fat dairy.  Take vitamin and mineral supplements as recommended by your health care provider.  Do not drink alcohol if: ? Your health care provider tells you not to drink. ? You are pregnant, may be pregnant, or are planning to become pregnant.  If you drink alcohol: ? Limit how much you have to 0-1 drink a day. ? Be aware of how much alcohol is in your drink. In the [place] one drink equals one 12 oz bottle of beer (355 mL), one 5 oz glass of wine (148 mL), or one 1 oz glass of hard liquor (44 mL). Lifestyle  Take daily care of your teeth and gums.  Stay active. Exercise for at least 30 minutes on 5 or more days each week.  Do not use any products that contain nicotine or tobacco, such as cigarettes, e-cigarettes, and chewing tobacco. If you need help quitting, ask your health care provider.  If you are sexually active, practice safe sex. Use a condom or other form of birth control (contraception) in order to prevent pregnancy and STIs (sexually transmitted infections). If you plan to become pregnant, see your health care provider for a preconception visit. What's next?  Visit your health care provider once a year for a well check visit.  Ask your health care provider how often you should have your eyes and teeth checked.  Stay up to date on all vaccines. This information is not intended to  replace advice given to you by your health care provider. Make sure you discuss any questions you have with your health care provider. Document Revised: 11/29/2017 Document Reviewed: 11/29/2017 Elsevier Patient Education  2020 [employer].

## 2019-05-19 NOTE — Progress Notes (Signed)
Subjective:    Patient ID: April Bowen, female    DOB: [birth year], 34 y.o.   MRN: [id number]  HPI  This visit occurred during the SARS-CoV-2 public health emergency.  Safety protocols were in place, including screening questions prior to the visit, additional usage of staff PPE, and extensive cleaning of exam room while observing appropriate contact time as indicated for disinfecting solutions.   April Bowen is a 34 year old female who presents today for complete physical.  She would also like to discuss migraines. Chronic history of migraines for years, improved after the birth of her last child, now more frequent. Migraines are located to the occipital and frontal lobes unilaterally. Also with photophobia, phonophobia, nausea. Migraines are occurring once weekly on average lasting 1-3 days. Headaches occurring in between migraines. She's been taking Excedrin Migraine, BC powder with very little improvement. She was once managed Topamax HS for prevention and did well.   She would also like to discuss anxiety and depression. She would like to increase the dose of her Zoloft as she's finding it not as effective. She initially noticed improvement but feels as though the medication is wearing off.  Symptoms include increased anxiety, tearfulness, decreased patience, irritability.   Immunizations: -Tetanus: Completed in 2018 -Influenza: Declines   Diet: She endorses a fair diet. Exercise: She is not exercising.  Eye exam: No recent exam Dental exam: Due, she will schedule   Pap Smear: Completes per GYN. UTD.   BP Readings from Last 3 Encounters:  05/19/19 118/80  04/05/19 135/80  09/25/18 117/80       Review of Systems  Constitutional: Negative for unexpected weight change.  HENT: Negative for rhinorrhea.   Respiratory: Negative for cough and shortness of breath.   Cardiovascular: Negative for chest pain.  Gastrointestinal: Negative for constipation and diarrhea.    Genitourinary: Negative for difficulty urinating.  Musculoskeletal: Negative for arthralgias and myalgias.  Skin: Negative for rash.  Allergic/Immunologic: Negative for environmental allergies.  Neurological: Positive for headaches. Negative for dizziness and numbness.       See HPI  Psychiatric/Behavioral:       See HPI       Past Medical History:  Diagnosis Date  . Depression   . Ectopic pregnancy   . Ectopic pregnancy, tubal 10/31/2010  . Migraines   . Ovarian cyst   . PCOS (polycystic ovarian syndrome)      Social History   Socioeconomic History  . Marital status: Single    Spouse name: Not on file  . Number of children: Not on file  . Years of education: Not on file  . Highest education level: Not on file  Occupational History  . Not on file  Tobacco Use  . Smoking status: Never Smoker  . Smokeless tobacco: Never Used  Substance and Sexual Activity  . Alcohol use: No  . Drug use: No  . Sexual activity: Yes  Other Topics Concern  . Not on file  Social History Narrative   Moved from [place], from [place].   Works with [employer].   Single.   1 child.    Social Determinants of Health   Financial Resource Strain:   . Difficulty of Paying Living Expenses: Not on file  Food Insecurity:   . Worried About Programme researcher, broadcasting/film/video in the Last Year: Not on file  . Ran Out of Food in the Last Year: Not on file  Transportation Needs:   . Lack of Transportation (Medical):  Not on file  . Lack of Transportation (Non-Medical): Not on file  Physical Activity:   . Days of Exercise per Week: Not on file  . Minutes of Exercise per Session: Not on file  Stress:   . Feeling of Stress : Not on file  Social Connections:   . Frequency of Communication with Friends and Family: Not on file  . Frequency of Social Gatherings with Friends and Family: Not on file  . Attends Religious Services: Not on file  . Active Member of Clubs or Organizations: Not on file  . Attends English as a second language teacher Meetings: Not on file  . Marital Status: Not on file  Intimate Partner Violence:   . Fear of Current or Ex-Partner: Not on file  . Emotionally Abused: Not on file  . Physically Abused: Not on file  . Sexually Abused: Not on file    Past Surgical History:  Procedure Laterality Date  . ABDOMINAL HYSTERECTOMY  2018   one ovary remaining  . WISDOM TOOTH EXTRACTION      Family History  Problem Relation Age of Onset  . Hypertension Mother   . Arthritis Mother   . Hypercholesterolemia Mother   . Hypertension Father   . Arthritis Father   . Hypercholesterolemia Father   . Arthritis Maternal Grandmother   . Bone cancer Paternal Grandmother     No Known Allergies  Current Outpatient Medications on File Prior to Visit  Medication Sig Dispense Refill  . amLODipine (NORVASC) 5 MG tablet Take 1 tablet (5 mg total) by mouth daily. For blood pressure. 90 tablet 3  . hydrochlorothiazide (HYDRODIURIL) 25 MG tablet TAKE 1 TABLET BY MOUTH EVERY DAY FOR BLOOD PRESSURE 90 tablet 0   No current facility-administered medications on file prior to visit.    BP 118/80   Pulse 85   Temp (!) 96.8 F (36 C) (Temporal)   Ht 5\' 2"  (1.575 m)   Wt 153 lb 12 oz (69.7 kg)   LMP 12/18/2013   SpO2 98%   BMI 28.12 kg/m    Objective:   Physical Exam  Constitutional: She is oriented to person, place, and time. She appears well-nourished.  HENT:  Right Ear: Tympanic membrane and ear canal normal.  Left Ear: Tympanic membrane and ear canal normal.  Mouth/Throat: Oropharynx is clear and moist.  Eyes: Pupils are equal, round, and reactive to light. EOM are normal.  Cardiovascular: Normal rate and regular rhythm.  Respiratory: Effort normal and breath sounds normal.  GI: Soft. Bowel sounds are normal. There is no abdominal tenderness.  Musculoskeletal:        General: Normal range of motion.     Cervical back: Neck supple.  Neurological: She is alert and oriented to person, place,  and time. No cranial nerve deficit.  Reflex Scores:      Patellar reflexes are 2+ on the right side and 2+ on the left side. Skin: Skin is warm and dry.  Psychiatric: She has a normal mood and affect.           Assessment & Plan:

## 2019-05-19 NOTE — Assessment & Plan Note (Signed)
Improved initially, now feels not as effective. Increase dose of Zoloft to 50 mg. New Rx sent to pharmacy. She will update.

## 2019-05-19 NOTE — Assessment & Plan Note (Signed)
Resolved with treatment for E coli infection.

## 2019-05-19 NOTE — Assessment & Plan Note (Signed)
Stable in the office today, continue current regimen. 

## 2019-06-11 ENCOUNTER — Other Ambulatory Visit: Payer: Self-pay | Admitting: Primary Care

## 2019-06-11 DIAGNOSIS — G43709 Chronic migraine without aura, not intractable, without status migrainosus: Secondary | ICD-10-CM

## 2019-07-17 ENCOUNTER — Encounter: Payer: Self-pay | Admitting: Family Medicine

## 2019-07-17 ENCOUNTER — Telehealth (INDEPENDENT_AMBULATORY_CARE_PROVIDER_SITE_OTHER): Payer: BC Managed Care – PPO | Admitting: Family Medicine

## 2019-07-17 ENCOUNTER — Other Ambulatory Visit: Payer: Self-pay

## 2019-07-17 DIAGNOSIS — J029 Acute pharyngitis, unspecified: Secondary | ICD-10-CM | POA: Diagnosis not present

## 2019-07-17 MED ORDER — PREDNISONE 10 MG PO TABS: ORAL_TABLET | ORAL | 0 refills | Status: DC

## 2019-07-17 MED ORDER — AMOXICILLIN 500 MG PO CAPS: 500.0000 mg | ORAL_CAPSULE | Freq: Two times a day (BID) | ORAL | 0 refills | Status: DC

## 2019-07-17 MED ORDER — BENZONATATE 200 MG PO CAPS: 200.0000 mg | ORAL_CAPSULE | Freq: Three times a day (TID) | ORAL | 0 refills | Status: DC | PRN

## 2019-07-17 NOTE — Progress Notes (Signed)
Virtual Visit via Video Note  I connected with April Bowen on 07/17/19 at  9:00 AM EDT by a video enabled telemedicine application and verified that I am speaking with the correct person using two identifiers.  Location: Patient: home Provider: office   I discussed the limitations of evaluation and management by telemedicine and the availability of in person appointments. The patient expressed understanding and agreed to proceed.  Parties involved in encounter  Patient: April Bowen  Provider:  Roxy Manns MD    History of Present Illness: 34 yo pt of April Bowen presents with throat symptoms   She does have allergies/pollen  Taking xyzal and claritin  Throat is very scratchy- at first it hurt (aleve helped)  Nose is dripping /sneezing   She does not use nasal sprays  Except saline spray (from her daughter) for moisture   Now itchy and making her cough  Worse at night  Drinking lots of water   Red looking as well  No rash  No facial pain  No sinus pressure   No fever /no body aches or chills  No loss of taste or smell  Cough is not productive   Daughter is in daycare  She is not sick  No sick contacts - a friend has similar symptoms now who she visited    She had covid back on January  Fully recovered  Has not been vaccinated yet  Patient Active Problem List   Diagnosis Date Noted  . Preventative health care 05/19/2019  . Adjustment disorder with mixed anxiety and depressed mood 06/14/2018  . Essential hypertension 05/20/2018  . Foul smelling urine 05/20/2018  . Migraines 05/20/2018   Past Medical History:  Diagnosis Date  . Depression   . Ectopic pregnancy   . Ectopic pregnancy, tubal 10/31/2010  . Migraines   . Ovarian cyst   . PCOS (polycystic ovarian syndrome)    Past Surgical History:  Procedure Laterality Date  . ABDOMINAL HYSTERECTOMY  2018   one ovary remaining  . WISDOM TOOTH EXTRACTION     Social History   Tobacco Use  .  Smoking status: Never Smoker  . Smokeless tobacco: Never Used  Substance Use Topics  . Alcohol use: No  . Drug use: No   Family History  Problem Relation Age of Onset  . Hypertension Mother   . Arthritis Mother   . Hypercholesterolemia Mother   . Hypertension Father   . Arthritis Father   . Hypercholesterolemia Father   . Arthritis Maternal Grandmother   . Bone cancer Paternal Grandmother    No Known Allergies Current Outpatient Medications on File Prior to Visit  Medication Sig Dispense Refill  . amLODipine (NORVASC) 5 MG tablet Take 1 tablet (5 mg total) by mouth daily. For blood pressure. 90 tablet 3  . hydrochlorothiazide (HYDRODIURIL) 25 MG tablet TAKE 1 TABLET BY MOUTH EVERY DAY FOR BLOOD PRESSURE 90 tablet 0  . sertraline (ZOLOFT) 50 MG tablet Take 1 tablet (50 mg total) by mouth daily. For anxiety and depression 90 tablet 3  . SUMAtriptan (IMITREX) 50 MG tablet TAKE 1 TABLET BY MOUTH AT MIGRAINE ONSET. MAY REPEAT IN 2 HOURS IF HEADACHE PERSISTS OR RECURS. 9 tablet 0  . topiramate (TOPAMAX) 50 MG tablet Take 1 tablet (50 mg total) by mouth at bedtime. For headache prevention. 90 tablet 0   No current facility-administered medications on file prior to visit.     Review of Systems  Constitutional: Negative for chills, fever and  malaise/fatigue.  HENT: Positive for sore throat. Negative for congestion, ear pain and sinus pain.        Rhinorrhea and sneezing  Eyes: Negative for blurred vision, discharge and redness.  Respiratory: Positive for cough. Negative for sputum production, shortness of breath, wheezing and stridor.   Cardiovascular: Negative for chest pain, palpitations and leg swelling.  Gastrointestinal: Negative for abdominal pain, diarrhea, nausea and vomiting.  Musculoskeletal: Negative for myalgias.  Skin: Negative for rash.  Neurological: Negative for dizziness and headaches.    Observations/Objective: Patient appears well, in no distress Weight is baseline   No facial swelling or asymmetry Normal voice-not hoarse and no slurred speech No obvious tremor or mobility impairment Moving neck and UEs normally Able to hear the call well  No cough or shortness of breath during interview  Talkative and mentally sharp with no cognitive changes No skin changes on face or neck , no rash or pallor Affect is normal    Assessment and Plan: Problem List Items Addressed This Visit      Other   Sore throat    Possible causes include allergies and infection  Enc fluids  Acetaminophen prn pain  amox sent in to cover strep  Prednisone for swelling/allergic reaction/inflammation Tessalon for tickle cough  Disc otc options for relief  Also salt water gargle  If not improved (or any known contacts) will get covid tested and call us  Update if not starting to improve in a week or if worsening            Follow Up Instructions: I sent in amoxicillin in case of strep Tessalon to help tickle in throat/cough  Also prednisone for inflammation and allergies (this may make you hyper and hungry)  Drink fluids  Salt water gargle and chloraseptic products may help throat  Consider covid testing if not improved or if you know of a contact   Acetaminophen (tylenol) is good for pain  Continue xyzal for allergies   Update if not starting to improve in a week or if worsening    I discussed the assessment and treatment plan with the patient. The patient was provided an opportunity to ask questions and all were answered. The patient agreed with the plan and demonstrated an understanding of the instructions.   The patient was advised to call back or seek an in-person evaluation if the symptoms worsen or if the condition fails to improve as anticipated.   Loura Pardon, MD

## 2019-07-17 NOTE — Assessment & Plan Note (Signed)
Possible causes include allergies and infection  Enc fluids  Acetaminophen prn pain  amox sent in to cover strep  Prednisone for swelling/allergic reaction/inflammation Tessalon for tickle cough  Disc otc options for relief  Also salt water gargle  If not improved (or any known contacts) will get covid tested and call us  Update if not starting to improve in a week or if worsening

## 2019-07-17 NOTE — Patient Instructions (Signed)
I sent in amoxicillin in case of strep Tessalon to help tickle in throat/cough  Also prednisone for inflammation and allergies (this may make you hyper and hungry)  Drink fluids  Salt water gargle and chloraseptic products may help throat  Consider covid testing if not improved or if you know of a contact   Acetaminophen (tylenol) is good for pain  Continue xyzal for allergies   Update if not starting to improve in a week or if worsening

## 2019-07-24 ENCOUNTER — Telehealth: Payer: Self-pay

## 2019-07-24 MED ORDER — FLUCONAZOLE 150 MG PO TABS: 150.0000 mg | ORAL_TABLET | Freq: Once | ORAL | 0 refills | Status: AC

## 2019-07-24 NOTE — Telephone Encounter (Signed)
I sent a diflucan to her pharmacy   F/u if no improvement

## 2019-07-24 NOTE — Telephone Encounter (Signed)
Patient had a virtual visit with Dr Milinda Antis on 07/17/19 and was started on antibiotic. Patient forgot to mention that she usually gets yeast infection from antibiotic and is now having vaginal itching, slight discharge and vaginal discomfort. Patient would like medication to help with that if possible. CB is [phone] [facility]

## 2019-07-24 NOTE — Telephone Encounter (Signed)
Pt notified Rx sent and advised of Dr. Tower's comments  

## 2019-07-25 DIAGNOSIS — B373 Candidiasis of vulva and vagina: Secondary | ICD-10-CM | POA: Diagnosis not present

## 2019-08-10 ENCOUNTER — Other Ambulatory Visit: Payer: Self-pay | Admitting: Primary Care

## 2019-08-10 DIAGNOSIS — G43709 Chronic migraine without aura, not intractable, without status migrainosus: Secondary | ICD-10-CM

## 2019-08-12 DIAGNOSIS — Z03818 Encounter for observation for suspected exposure to other biological agents ruled out: Secondary | ICD-10-CM | POA: Diagnosis not present

## 2019-08-12 DIAGNOSIS — Z20828 Contact with and (suspected) exposure to other viral communicable diseases: Secondary | ICD-10-CM | POA: Diagnosis not present

## 2019-08-18 ENCOUNTER — Telehealth (INDEPENDENT_AMBULATORY_CARE_PROVIDER_SITE_OTHER): Payer: BC Managed Care – PPO | Admitting: Internal Medicine

## 2019-08-18 ENCOUNTER — Encounter: Payer: Self-pay | Admitting: Internal Medicine

## 2019-08-18 DIAGNOSIS — J329 Chronic sinusitis, unspecified: Secondary | ICD-10-CM

## 2019-08-18 DIAGNOSIS — B9789 Other viral agents as the cause of diseases classified elsewhere: Secondary | ICD-10-CM | POA: Diagnosis not present

## 2019-08-18 MED ORDER — PREDNISONE 10 MG PO TABS: ORAL_TABLET | ORAL | 0 refills | Status: DC

## 2019-08-18 NOTE — Progress Notes (Signed)
Virtual Visit via Video Note  I connected with April Bowen on 50/53/97 at 12:15 PM EDT by a video enabled telemedicine application and verified that I am speaking with the correct person using two identifiers.  Location: Patient: Home Provider: Office   I discussed the limitations of evaluation and management by telemedicine and the availability of in person appointments. The patient expressed understanding and agreed to proceed.  History of Present Illness:  Patient reports headache, facial pain and pressure, ear fullness and nasal congestion.  She reports this started 5 days ago.  The headache is located in her forehead and behind her eyes.  She describes the pain as pressure.  She denies dizziness or visual changes.  She reports the facial pain is in her cheekbones.  She can hear popping in her ears but denies drainage or loss of hearing.  She is blowing green mucus out of her nose.  She denies eye redness, pain or discharge, runny nose, sore throat, loss of taste/smell, cough or shortness of breath.  She denies fever, chills or body aches.  She has tried Tylenol Sinus and Xyzal OT no relief.  She has not had exposure to Covid that she is aware of.  She does not smoke.    Past Medical History:  Diagnosis Date  . Depression   . Ectopic pregnancy   . Ectopic pregnancy, tubal 10/31/2010  . Migraines   . Ovarian cyst   . PCOS (polycystic ovarian syndrome)     Current Outpatient Medications  Medication Sig Dispense Refill  . amLODipine (NORVASC) 5 MG tablet Take 1 tablet (5 mg total) by mouth daily. For blood pressure. 90 tablet 3  . hydrochlorothiazide (HYDRODIURIL) 25 MG tablet TAKE 1 TABLET BY MOUTH EVERY DAY FOR BLOOD PRESSURE 90 tablet 0  . sertraline (ZOLOFT) 50 MG tablet Take 1 tablet (50 mg total) by mouth daily. For anxiety and depression 90 tablet 3  . SUMAtriptan (IMITREX) 50 MG tablet TAKE 1 TABLET BY MOUTH AT MIGRAINE ONSET. MAY REPEAT IN 2 HOURS IF HEADACHE PERSISTS OR  RECURS. 9 tablet 0  . topiramate (TOPAMAX) 50 MG tablet TAKE 1 TABLET (50 MG TOTAL) BY MOUTH AT BEDTIME. FOR HEADACHE PREVENTION. 90 tablet 0  . predniSONE (DELTASONE) 10 MG tablet Take 6 tabs day 1, 5 tabs day 2, 4 tabs day 3, 3 tabs day 4, 2 tabs day 5, 1 tab day 6 21 tablet 0   No current facility-administered medications for this visit.    No Known Allergies  Family History  Problem Relation Age of Onset  . Hypertension Mother   . Arthritis Mother   . Hypercholesterolemia Mother   . Hypertension Father   . Arthritis Father   . Hypercholesterolemia Father   . Arthritis Maternal Grandmother   . Bone cancer Paternal Grandmother     Social History   Socioeconomic History  . Marital status: Single    Spouse name: Not on file  . Number of children: Not on file  . Years of education: Not on file  . Highest education level: Not on file  Occupational History  . Not on file  Tobacco Use  . Smoking status: Never Smoker  . Smokeless tobacco: Never Used  Substance and Sexual Activity  . Alcohol use: No  . Drug use: No  . Sexual activity: Yes  Other Topics Concern  . Not on file  Social History Narrative   Moved from [place], from [place].   Works with [employer].  Single.   1 child.    Social Determinants of Health   Financial Resource Strain:   . Difficulty of Paying Living Expenses:   Food Insecurity:   . Worried About Programme researcher, broadcasting/film/video in the Last Year:   . Barista in the Last Year:   Transportation Needs:   . Freight forwarder (Medical):   Marland Kitchen Lack of Transportation (Non-Medical):   Physical Activity:   . Days of Exercise per Week:   . Minutes of Exercise per Session:   Stress:   . Feeling of Stress :   Social Connections:   . Frequency of Communication with Friends and Family:   . Frequency of Social Gatherings with Friends and Family:   . Attends Religious Services:   . Active Member of Clubs or Organizations:   . Attends Tax inspector Meetings:   Marland Kitchen Marital Status:   Intimate Partner Violence:   . Fear of Current or Ex-Partner:   . Emotionally Abused:   Marland Kitchen Physically Abused:   . Sexually Abused:      Constitutional: Patient reports headache.  Denies fever, malaise, fatigue, or abrupt weight changes.  HEENT: Patient reports facial pain and pressure, ear fullness and nasal congestion.  Denies eye pain, eye redness, ear pain, ringing in the ears, wax buildup, runny nose,  bloody nose, or sore throat. Respiratory: Denies difficulty breathing, shortness of breath, cough or sputum production.   Cardiovascular: Denies chest pain, chest tightness, palpitations or swelling in the hands or feet.   No other specific complaints in a complete review of systems (except as listed in HPI above).  Observations/Objective:  LMP 12/18/2013  Wt Readings from Last 3 Encounters:  05/19/19 153 lb 12 oz (69.7 kg)  04/05/19 152 lb (68.9 kg)  09/25/18 152 lb 9.6 oz (69.2 kg)    General: Appears her stated age, in NAD. HEENT: Head: normal shape and size, maxillary sinus tenderness noted; Nose: Congestion noted; Throat/Mouth: No hoarseness noted.  Pulmonary/Chest: Normal effort. No respiratory distress.  Neurological: Alert and oriented.    BMET    Component Value Date/Time   NA 138 05/19/2019 0925   NA 136 08/29/2012 1401   K 4.1 05/19/2019 0925   K 4.1 08/29/2012 1401   CL 107 05/19/2019 0925   CL 107 08/29/2012 1401   CO2 25 05/19/2019 0925   CO2 24 08/29/2012 1401   GLUCOSE 80 05/19/2019 0925   GLUCOSE 85 08/29/2012 1401   BUN 11 05/19/2019 0925   BUN 10 08/29/2012 1401   CREATININE 0.77 05/19/2019 0925   CREATININE 0.74 08/29/2012 1401   CALCIUM 8.8 05/19/2019 0925   CALCIUM 8.9 08/29/2012 1401   GFRNONAA >90 01/17/2014 1915   GFRNONAA >60 08/29/2012 1401   GFRAA >90 01/17/2014 1915   GFRAA >60 08/29/2012 1401    Lipid Panel     Component Value Date/Time   CHOL 123 05/19/2019 0925   TRIG 113.0  05/19/2019 0925   HDL 43.40 05/19/2019 0925   CHOLHDL 3 05/19/2019 0925   VLDL 22.6 05/19/2019 0925   LDLCALC 57 05/19/2019 0925    CBC    Component Value Date/Time   WBC 7.5 05/19/2019 0925   RBC 3.84 (L) 05/19/2019 0925   HGB 11.9 (L) 05/19/2019 0925   HGB 13.1 08/29/2012 1401   HCT 35.7 (L) 05/19/2019 0925   HCT 37.8 08/29/2012 1401   PLT 180.0 05/19/2019 0925   PLT 201 08/29/2012 1401   MCV 92.9 05/19/2019 0925  MCV 93 08/29/2012 1401   MCH 31.1 01/17/2014 1915   MCHC 33.4 05/19/2019 0925   RDW 13.7 05/19/2019 0925   RDW 13.0 08/29/2012 1401   LYMPHSABS 2.3 11/06/2010 1320   MONOABS 0.3 11/06/2010 1320   EOSABS 0.1 11/06/2010 1320   BASOSABS 0.0 11/06/2010 1320    Hgb A1C Lab Results  Component Value Date   HGBA1C 5.6 05/20/2018       Assessment and Plan:  Viral Sinusitis:  Continue Xyzal Rx for Pred taper x6 days If no improvement by Thursday, consider Augmentin 875-125 mg 1 tab p.o. twice daily x10 days  Return precautions discussed Follow Up Instructions:    I discussed the assessment and treatment plan with the patient. The patient was provided an opportunity to ask questions and all were answered. The patient agreed with the plan and demonstrated an understanding of the instructions.   The patient was advised to call back or seek an in-person evaluation if the symptoms worsen or if the condition fails to improve as anticipated.    Nicki Reaper, NP

## 2019-10-15 ENCOUNTER — Other Ambulatory Visit: Payer: Self-pay | Admitting: Primary Care

## 2019-10-15 DIAGNOSIS — I10 Essential (primary) hypertension: Secondary | ICD-10-CM

## 2020-02-20 ENCOUNTER — Telehealth: Payer: Self-pay

## 2020-02-20 NOTE — Telephone Encounter (Signed)
Can we get her in for Monday 11/22?

## 2020-02-20 NOTE — Telephone Encounter (Signed)
Pt said lt side of face started twitching primarily at lt cheek; no pain, no H/A, no dizziness; no difficulty in blinking eye; eye opens and shuts normally and pts can smile normally. Pt had spoken with access nurse and was advised to go to ED for eval. Pt did not want to go to ED; no available appts at [facility] and pt is going to [facility] on [address] in [place]. FYI to Allayne Gitelman NP; when access nurse comes over portal will add to this note.

## 2020-02-20 NOTE — Telephone Encounter (Signed)
[facility] Day - Client TELEPHONE ADVICE RECORD AccessNurse Patient Name: April Bowen Gender: Female DOB: Apr 09, 1985 Age: 34 Y 45 M 2 D Return Phone Number: [phone] (Primary) Address: City/State/Zip: [place] [address] Client [facility] Day - Client Client Site [facility] - Day Contact Type Call Who Is Calling Patient / Member / Family / Caregiver Call Type Triage / Clinical Relationship To Patient Self Return Phone Number [phone] (Primary) Chief Complaint Muscle Jerks, Tics And Shudders Reason for Call Symptomatic / Request for Health Information Initial Comment Caller states patient has left side facial twitching Translation No Nurse Assessment Nurse: Annye English, RN, Angelique Blonder Date/Time (Eastern Time): 02/20/2020 10:49:54 AM Confirm and document reason for call. If symptomatic, describe symptoms. ---Caller states patient has left side facial twitching at her cheek. No pain. Does the patient have any new or worsening symptoms? ---Yes Will a triage be completed? ---Yes Related visit to physician within the last 2 weeks? ---No Does the PT have any chronic conditions? (i.e. diabetes, asthma, this includes High risk factors for pregnancy, etc.) ---Yes List chronic conditions. ---HTN Is the patient pregnant or possibly pregnant? (Ask all females between the ages of 49-55) ---No Is this a behavioral health or substance abuse call? ---No Guidelines Guideline Title Affirmed Question Affirmed Notes Nurse Date/Time (Eastern Time) Muscle Jerks - Tics - Shudders [1] New-onset muscle jerks (twitches, spasms) AND [2] present now [employer], RN, Denise 02/20/2020 10:50:30 AM Disp. Time Lamount Cohen Time) Disposition Final User 02/20/2020 10:52:05 AM Go to ED Now (or PCP triage) Yes Carmon, RN, Leighton Ruff Disagree/Comply Comply Caller Understands Yes PreDisposition Call Doctor PLEASE NOTE: All timestamps  contained within this report are represented as [place] Standard Time. CONFIDENTIALTY NOTICE: This fax transmission is intended only for the addressee. It contains information that is legally privileged, confidential or otherwise protected from use or disclosure. If you are not the intended recipient, you are strictly prohibited from reviewing, disclosing, copying using or disseminating any of this information or taking any action in reliance on or regarding this information. If you have received this fax in error, please notify us immediately by telephone so that we can arrange for its return to [place]. Phone: [phone], Toll-Free: [phone], Fax: [phone] Page: 2 of 2 Call Id: [id number] Care Advice Given Per Guideline GO TO ED NOW (OR PCP TRIAGE): CARE ADVICE given per Muscle Jerks - Tics - Shudders (Adult) guideline. Referrals REFERRED TO PCP OFFICE

## 2020-02-23 NOTE — Telephone Encounter (Signed)
Called patient to schedule OV. Jae Dire gave ok to be added on 1220 on 11/22. LVM to call back.

## 2020-02-25 NOTE — Telephone Encounter (Signed)
Called patient to schedule OV. LVM to call back.  °

## 2020-04-12 ENCOUNTER — Other Ambulatory Visit: Payer: Self-pay

## 2020-04-12 DIAGNOSIS — G43709 Chronic migraine without aura, not intractable, without status migrainosus: Secondary | ICD-10-CM

## 2020-04-12 DIAGNOSIS — I10 Essential (primary) hypertension: Secondary | ICD-10-CM

## 2020-04-12 MED ORDER — TOPIRAMATE 50 MG PO TABS: 50.0000 mg | ORAL_TABLET | Freq: Every day | ORAL | 0 refills | Status: DC

## 2020-04-12 MED ORDER — SUMATRIPTAN SUCCINATE 50 MG PO TABS: ORAL_TABLET | ORAL | 0 refills | Status: DC

## 2020-04-12 MED ORDER — HYDROCHLOROTHIAZIDE 25 MG PO TABS: ORAL_TABLET | ORAL | 0 refills | Status: DC

## 2020-04-13 ENCOUNTER — Telehealth: Payer: Self-pay | Admitting: Primary Care

## 2020-04-13 DIAGNOSIS — G43709 Chronic migraine without aura, not intractable, without status migrainosus: Secondary | ICD-10-CM

## 2020-04-13 DIAGNOSIS — I10 Essential (primary) hypertension: Secondary | ICD-10-CM

## 2020-04-13 NOTE — Telephone Encounter (Signed)
Pt stated she is needing [facility] are no longer using her previous pharmacy with her current insurance. Please advise. States she has 5 days left of amlodipine.

## 2020-04-14 MED ORDER — SUMATRIPTAN SUCCINATE 50 MG PO TABS: ORAL_TABLET | ORAL | 0 refills | Status: DC

## 2020-04-14 MED ORDER — AMLODIPINE BESYLATE 5 MG PO TABS: 5.0000 mg | ORAL_TABLET | Freq: Every day | ORAL | 1 refills | Status: DC

## 2020-04-14 MED ORDER — TOPIRAMATE 50 MG PO TABS: 50.0000 mg | ORAL_TABLET | Freq: Every day | ORAL | 0 refills | Status: DC

## 2020-04-14 NOTE — Telephone Encounter (Signed)
Spoke with patient who stated that all of her medications should be sent to [facility], which was changed in the chart. Prescription refill for Amlodipine, Sumatriptan, and Topiramate sent to new pharmacy per protocol.

## 2020-05-20 ENCOUNTER — Ambulatory Visit (INDEPENDENT_AMBULATORY_CARE_PROVIDER_SITE_OTHER): Payer: BC Managed Care – PPO | Admitting: Primary Care

## 2020-05-20 ENCOUNTER — Other Ambulatory Visit: Payer: Self-pay

## 2020-05-20 ENCOUNTER — Encounter: Payer: Self-pay | Admitting: Primary Care

## 2020-05-20 VITALS — BP 110/74 | HR 87 | Temp 97.7°F | Ht 62.0 in | Wt 151.0 lb

## 2020-05-20 DIAGNOSIS — R040 Epistaxis: Secondary | ICD-10-CM

## 2020-05-20 DIAGNOSIS — Z0001 Encounter for general adult medical examination with abnormal findings: Secondary | ICD-10-CM

## 2020-05-20 DIAGNOSIS — Z114 Encounter for screening for human immunodeficiency virus [HIV]: Secondary | ICD-10-CM | POA: Diagnosis not present

## 2020-05-20 DIAGNOSIS — G43709 Chronic migraine without aura, not intractable, without status migrainosus: Secondary | ICD-10-CM

## 2020-05-20 DIAGNOSIS — I10 Essential (primary) hypertension: Secondary | ICD-10-CM

## 2020-05-20 DIAGNOSIS — Z1159 Encounter for screening for other viral diseases: Secondary | ICD-10-CM | POA: Diagnosis not present

## 2020-05-20 DIAGNOSIS — F4323 Adjustment disorder with mixed anxiety and depressed mood: Secondary | ICD-10-CM

## 2020-05-20 HISTORY — DX: Epistaxis: R04.0

## 2020-05-20 LAB — LIPID PANEL
Cholesterol: 140 mg/dL (ref 0–200)
HDL: 53.1 mg/dL (ref >39.00)
LDL Cholesterol: 71 mg/dL (ref 0–99)
NonHDL: 86.52
Total CHOL/HDL Ratio: 3
Triglycerides: 80 mg/dL (ref 0.0–149.0)
VLDL: 16 mg/dL (ref 0.0–40.0)

## 2020-05-20 LAB — COMPREHENSIVE METABOLIC PANEL
ALT: 10 U/L (ref 0–35)
AST: 14 U/L (ref 0–37)
Albumin: 4.4 g/dL (ref 3.5–5.2)
Alkaline Phosphatase: 45 U/L (ref 39–117)
BUN: 10 mg/dL (ref 6–23)
CO2: 28 mEq/L (ref 19–32)
Calcium: 9.6 mg/dL (ref 8.4–10.5)
Chloride: 103 mEq/L (ref 96–112)
Creatinine, Ser: 0.84 mg/dL (ref 0.40–1.20)
GFR: 90.3 mL/min (ref >60.00)
Glucose, Bld: 83 mg/dL (ref 70–99)
Potassium: 3.8 mEq/L (ref 3.5–5.1)
Sodium: 137 mEq/L (ref 135–145)
Total Bilirubin: 0.8 mg/dL (ref 0.2–1.2)
Total Protein: 7.6 g/dL (ref 6.0–8.3)

## 2020-05-20 LAB — CBC
HCT: 37.7 % (ref 36.0–46.0)
Hemoglobin: 12.8 g/dL (ref 12.0–15.0)
MCHC: 33.9 g/dL (ref 30.0–36.0)
MCV: 91.8 fl (ref 78.0–100.0)
Platelets: 167 10*3/uL (ref 150.0–400.0)
RBC: 4.11 Mil/uL (ref 3.87–5.11)
RDW: 13.4 % (ref 11.5–15.5)
WBC: 5.3 10*3/uL (ref 4.0–10.5)

## 2020-05-20 MED ORDER — SERTRALINE HCL 50 MG PO TABS: 75.0000 mg | ORAL_TABLET | Freq: Every day | ORAL | 0 refills | Status: DC

## 2020-05-20 NOTE — Assessment & Plan Note (Signed)
Increased anxiety over the last few months, likely secondary to work stressors.   Discussed options, she agrees to meet with therapy, referral placed. She would also like to try a dose increase of Zoloft. Will start with 75 mg for 1-2 weeks, then increase to 100 mg if needed.  She will call and update.

## 2020-05-20 NOTE — Assessment & Plan Note (Signed)
Declines influenza vaccine, other immunizations UTD. Pap smear UTD, follows with GYN. Discussed the importance of a healthy diet and regular exercise in order for weight loss, and to reduce the risk of any potential medical problems.  Exam today as noted. Labs pending.

## 2020-05-20 NOTE — Patient Instructions (Signed)
Stop by the lab prior to leaving today. I will notify you of your results once received.   You will be contacted regarding your referral to ENT and to therapy.  Please let us know if you have not been contacted within two weeks.   We increased your Zoloft dose to 75 mg for now, take 1 and 1/2 tablet for 1-2 weeks, then increase to 2 tablets (100 mg) thereafter if needed.  Please do update me regarding your Zoloft dose.  It was a pleasure to see you today!   Preventive Care 54-67 Years Old, Female Preventive care refers to lifestyle choices and visits with your health care provider that can promote health and wellness. This includes:  A yearly physical exam. This is also called an annual wellness visit.  Regular dental and eye exams.  Immunizations.  Screening for certain conditions.  Healthy lifestyle choices, such as: ? Eating a healthy diet. ? Getting regular exercise. ? Not using drugs or products that contain nicotine and tobacco. ? Limiting alcohol use. What can I expect for my preventive care visit? Physical exam Your health care provider may check your:  Height and weight. These may be used to calculate your BMI (body mass index). BMI is a measurement that tells if you are at a healthy weight.  Heart rate and blood pressure.  Body temperature.  Skin for abnormal spots. Counseling Your health care provider may ask you questions about your:  Past medical problems.  Family's medical history.  Alcohol, tobacco, and drug use.  Emotional well-being.  Home life and relationship well-being.  Sexual activity.  Diet, exercise, and sleep habits.  Work and work Statistician.  Access to firearms.  Method of birth control.  Menstrual cycle.  Pregnancy history. What immunizations do I need? Vaccines are usually given at various ages, according to a schedule. Your health care provider will recommend vaccines for you based on your age, medical history, and  lifestyle or other factors, such as travel or where you work.   What tests do I need? Blood tests  Lipid and cholesterol levels. These may be checked every 5 years starting at age 72.  Hepatitis C test.  Hepatitis B test. Screening  Diabetes screening. This is done by checking your blood sugar (glucose) after you have not eaten for a while (fasting).  STD (sexually transmitted disease) testing, if you are at risk.  BRCA-related cancer screening. This may be done if you have a family history of breast, ovarian, tubal, or peritoneal cancers.  Pelvic exam and Pap test. This may be done every 3 years starting at age 36. Starting at age 82, this may be done every 5 years if you have a Pap test in combination with an HPV test. Talk with your health care provider about your test results, treatment options, and if necessary, the need for more tests.   Follow these instructions at home: Eating and drinking  Eat a healthy diet that includes fresh fruits and vegetables, whole grains, lean protein, and low-fat dairy products.  Take vitamin and mineral supplements as recommended by your health care provider.  Do not drink alcohol if: ? Your health care provider tells you not to drink. ? You are pregnant, may be pregnant, or are planning to become pregnant.  If you drink alcohol: ? Limit how much you have to 0-1 drink a day. ? Be aware of how much alcohol is in your drink. In the [place] one drink equals one 12 oz bottle  of beer (355 mL), one 5 oz glass of wine (148 mL), or one 1 oz glass of hard liquor (44 mL).   Lifestyle  Take daily care of your teeth and gums. Brush your teeth every morning and night with fluoride toothpaste. Floss one time each day.  Stay active. Exercise for at least 30 minutes 5 or more days each week.  Do not use any products that contain nicotine or tobacco, such as cigarettes, e-cigarettes, and chewing tobacco. If you need help quitting, ask your health care  provider.  Do not use drugs.  If you are sexually active, practice safe sex. Use a condom or other form of protection to prevent STIs (sexually transmitted infections).  If you do not wish to become pregnant, use a form of birth control. If you plan to become pregnant, see your health care provider for a prepregnancy visit.  Find healthy ways to cope with stress, such as: ? Meditation, yoga, or listening to music. ? Journaling. ? Talking to a trusted person. ? Spending time with friends and family. Safety  Always wear your seat belt while driving or riding in a vehicle.  Do not drive: ? If you have been drinking alcohol. Do not ride with someone who has been drinking. ? When you are tired or distracted. ? While texting.  Wear a helmet and other protective equipment during sports activities.  If you have firearms in your house, make sure you follow all gun safety procedures.  Seek help if you have been physically or sexually abused. What's next?  Go to your health care provider once a year for an annual wellness visit.  Ask your health care provider how often you should have your eyes and teeth checked.  Stay up to date on all vaccines. This information is not intended to replace advice given to you by your health care provider. Make sure you discuss any questions you have with your health care provider. Document Revised: 11/16/2019 Document Reviewed: 11/29/2017 Elsevier Patient Education  2021 [employer].

## 2020-05-20 NOTE — Assessment & Plan Note (Signed)
Migraines occurring 2-3 times monthly, has not been taking Topamax consistently, also with increased stress with work.   She will resume Topamax 50 mg daily and update if migraines do not improve. Continue PRN sumatriptan.

## 2020-05-20 NOTE — Progress Notes (Signed)
Subjective:    Patient ID: April Bowen, female    DOB: 18-Sep-1985, 35 y.o.   MRN: [id number]  HPI  This visit occurred during the SARS-CoV-2 public health emergency.  Safety protocols were in place, including screening questions prior to the visit, additional usage of staff PPE, and extensive cleaning of exam room while observing appropriate contact time as indicated for disinfecting solutions.   April Bowen is a 35 year old female who presents today for complete physical.  She is checking her BP at home which ranging 110's/80's-90's.   She would also like to discuss nosebleeds. Chronic history which dates back to the age of 29, had to have her nose cauterized at the time. Over the last four months she's noticed daily nose bleeds with clotting to both nostrils, takes about 30 minutes to stop bleeding, sometimes bleeding will wake her from sleep.  She's been using vasaline, saline spray, and using a humidifier.   She has also noticed an increase in anxiety, increased irritability, daily worry. She's has of stress with work which has contributed to symptoms. She does feel that Zoloft 50 mg helps with depression, not sure if it's helping with anxiety. She is not meeting with therapy.   Immunizations: -Tetanus: 2018 -Influenza: Declines  -Covid-19: Completed three vaccines.  Diet: She endorses a fair diet.  Exercise: She is exercising three times weekly.   Eye exam:  No recent exam Dental exam: No recent exam  Pap Smear: Follows with GYN, UTD.  BP Readings from Last 3 Encounters:  05/20/20 110/74  05/19/19 118/80  04/05/19 135/80   Wt Readings from Last 3 Encounters:  05/20/20 151 lb (68.5 kg)  05/19/19 153 lb 12 oz (69.7 kg)  04/05/19 152 lb (68.9 kg)     Review of Systems  Constitutional: Negative for unexpected weight change.  HENT: Positive for nosebleeds. Negative for rhinorrhea.   Eyes: Negative for visual disturbance.  Respiratory: Negative for cough and  shortness of breath.   Cardiovascular: Negative for chest pain.  Gastrointestinal: Negative for constipation and diarrhea.  Genitourinary: Negative for difficulty urinating.  Musculoskeletal: Negative for arthralgias and myalgias.  Skin: Negative for rash.  Allergic/Immunologic: Negative for environmental allergies.  Neurological: Negative for dizziness, numbness and headaches.  Psychiatric/Behavioral: The patient is nervous/anxious.        Past Medical History:  Diagnosis Date  . Depression   . Ectopic pregnancy   . Ectopic pregnancy, tubal 10/31/2010  . Migraines   . Ovarian cyst   . PCOS (polycystic ovarian syndrome)      Social History   Socioeconomic History  . Marital status: Single    Spouse name: Not on file  . Number of children: Not on file  . Years of education: Not on file  . Highest education level: Not on file  Occupational History  . Not on file  Tobacco Use  . Smoking status: Never Smoker  . Smokeless tobacco: Never Used  Substance and Sexual Activity  . Alcohol use: No  . Drug use: No  . Sexual activity: Yes  Other Topics Concern  . Not on file  Social History Narrative   Moved from [place], from [place].   Works with [employer].   Single.   1 child.    Social Determinants of Health   Financial Resource Strain: Not on file  Food Insecurity: Not on file  Transportation Needs: Not on file  Physical Activity: Not on file  Stress: Not on file  Social  Connections: Not on file  Intimate Partner Violence: Not on file    Past Surgical History:  Procedure Laterality Date  . ABDOMINAL HYSTERECTOMY  2018   one ovary remaining  . WISDOM TOOTH EXTRACTION      Family History  Problem Relation Age of Onset  . Hypertension Mother   . Arthritis Mother   . Hypercholesterolemia Mother   . Hypertension Father   . Arthritis Father   . Hypercholesterolemia Father   . Arthritis Maternal Grandmother   . Bone cancer Paternal Grandmother     No  Known Allergies  Current Outpatient Medications on File Prior to Visit  Medication Sig Dispense Refill  . amLODipine (NORVASC) 5 MG tablet Take 1 tablet (5 mg total) by mouth daily. For blood pressure. 90 tablet 1  . hydrochlorothiazide (HYDRODIURIL) 25 MG tablet Take one tablet daily for blood pressure. 90 tablet 0  . sertraline (ZOLOFT) 50 MG tablet Take 1 tablet (50 mg total) by mouth daily. For anxiety and depression 90 tablet 3  . SUMAtriptan (IMITREX) 50 MG tablet May repeat in 2 hours if headache persists or recurs. 9 tablet 0  . topiramate (TOPAMAX) 50 MG tablet Take 1 tablet (50 mg total) by mouth at bedtime. For headache prevention. 90 tablet 0   No current facility-administered medications on file prior to visit.    BP 110/74   Pulse 87   Temp 97.7 F (36.5 C) (Temporal)   Ht 5\' 2"  (1.575 m)   Wt 151 lb (68.5 kg)   LMP 12/18/2013   SpO2 99%   BMI 27.62 kg/m    Objective:   Physical Exam Constitutional:      Appearance: She is well-nourished.  HENT:     Right Ear: Tympanic membrane and ear canal normal.     Left Ear: Tympanic membrane and ear canal normal.     Nose: Nose normal.     Comments: No bleeding, nasal polyp noted to right nares.    Mouth/Throat:     Mouth: Oropharynx is clear and moist.  Eyes:     Extraocular Movements: EOM normal.     Pupils: Pupils are equal, round, and reactive to light.  Cardiovascular:     Rate and Rhythm: Normal rate and regular rhythm.  Pulmonary:     Effort: Pulmonary effort is normal.     Breath sounds: Normal breath sounds.  Abdominal:     General: Bowel sounds are normal.     Palpations: Abdomen is soft.     Tenderness: There is no abdominal tenderness.  Musculoskeletal:        General: Normal range of motion.     Cervical back: Neck supple.  Skin:    General: Skin is warm and dry.  Neurological:     Mental Status: She is alert and oriented to person, place, and time.     Cranial Nerves: No cranial nerve deficit.      Deep Tendon Reflexes:     Reflex Scores:      Patellar reflexes are 2+ on the right side and 2+ on the left side. Psychiatric:        Mood and Affect: Mood and affect and mood normal.            Assessment & Plan:

## 2020-05-20 NOTE — Assessment & Plan Note (Signed)
Well controlled in the office today, continue amlodipine 5 mg and HCTZ 25 mg. CMP pending.

## 2020-05-20 NOTE — Assessment & Plan Note (Signed)
Chronic, worse over the last 4 months with large clots.  Referral placed to ENT.

## 2020-05-21 LAB — HEPATITIS C ANTIBODY
Hepatitis C Ab: NONREACTIVE
SIGNAL TO CUT-OFF: 0.01 (ref <1.00)

## 2020-05-21 LAB — HIV ANTIBODY (ROUTINE TESTING W REFLEX): HIV 1&2 Ab, 4th Generation: NONREACTIVE

## 2020-05-24 ENCOUNTER — Encounter: Payer: Self-pay | Admitting: Primary Care

## 2020-05-24 LAB — RPR: RPR: NONREACTIVE

## 2020-05-26 ENCOUNTER — Other Ambulatory Visit: Payer: Self-pay | Admitting: Primary Care

## 2020-05-26 DIAGNOSIS — F4323 Adjustment disorder with mixed anxiety and depressed mood: Secondary | ICD-10-CM

## 2020-06-09 ENCOUNTER — Other Ambulatory Visit: Payer: Self-pay | Admitting: Primary Care

## 2020-06-09 DIAGNOSIS — G43709 Chronic migraine without aura, not intractable, without status migrainosus: Secondary | ICD-10-CM

## 2020-06-09 NOTE — Telephone Encounter (Signed)
Received refill request for sumatriptan. During our recent visit in February we discussed for her to resume her topiramate 50 mg at bedtime for headache prevention.  Did she resume her topiramate? Is this helping?  Does she actually need a refill of her sumatriptan?

## 2020-06-10 NOTE — Telephone Encounter (Signed)
Left message to return call to our office.  

## 2020-06-15 NOTE — Telephone Encounter (Signed)
Left message to return call to our office.  

## 2020-06-18 NOTE — Telephone Encounter (Signed)
Patient is calling in, she is currently taking the Topiramate 50 MG and it is helping but states the day she requested the refill she had a horrible migaraine and thought that if she were to have migraines she is to take both Topiramate and Sumatriptan together.

## 2020-06-20 NOTE — Telephone Encounter (Signed)
topamax is taken everyday, so if she does have a migraine it's okay to take sumatriptan.  She refilled sumatriptan in Jan 2022,  Is she already out of sumatriptan? This is a PRN medication only.  Glad the topamax is helping!

## 2020-06-21 NOTE — Telephone Encounter (Signed)
Called patient states that she had used more frequently when she was not taking Topamax correctly. Now that she has been on it right from last visit and has not had to take Imitrex but she is down to one tab and wanted to make sure has some on hand.

## 2020-06-22 NOTE — Telephone Encounter (Signed)
Noted, refill sent to pharmacy. 

## 2020-09-07 ENCOUNTER — Encounter: Payer: Self-pay | Admitting: Primary Care

## 2020-09-07 ENCOUNTER — Other Ambulatory Visit: Payer: Self-pay

## 2020-09-07 ENCOUNTER — Ambulatory Visit: Payer: BC Managed Care – PPO | Admitting: Primary Care

## 2020-09-07 VITALS — BP 118/72 | HR 82 | Temp 98.2°F | Ht 62.0 in | Wt 148.0 lb

## 2020-09-07 DIAGNOSIS — F4323 Adjustment disorder with mixed anxiety and depressed mood: Secondary | ICD-10-CM

## 2020-09-07 DIAGNOSIS — G43709 Chronic migraine without aura, not intractable, without status migrainosus: Secondary | ICD-10-CM | POA: Diagnosis not present

## 2020-09-07 MED ORDER — SERTRALINE HCL 100 MG PO TABS: 100.0000 mg | ORAL_TABLET | Freq: Every day | ORAL | 1 refills | Status: DC

## 2020-09-07 NOTE — Patient Instructions (Signed)
You will be contacted regarding your referral to therapy.  Please let us know if you have not been contacted within two weeks.   We increased your dose of sertraline (Zoloft) to 100 mg. I sent this prescription to [facility].  Start date for FMLA will be June 8th through July 5th with a return date of July 6th.  Schedule a follow up visit with me for July 5th.  It was a pleasure to see you today!

## 2020-09-07 NOTE — Assessment & Plan Note (Signed)
Increased with stress/anxiety. Continue Topamax 50 mg for now, she will update if headaches persist despite reduction of anxiety.

## 2020-09-07 NOTE — Assessment & Plan Note (Signed)
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   We decided to increase her Zoloft to 100 mg and refer her for therapy.  We will begin FMLA on June 8th through July 5th with a return to work date of July 6th. She will follow up with me on July 5th.

## 2020-09-07 NOTE — Progress Notes (Signed)
Subjective:    Patient ID: April Bowen, female    DOB: 06-Jan-1986, 35 y.o.   MRN: [id number]  HPI  April Bowen is a very pleasant 34 y.o. female with a history of anxiety and depression who presents today to discuss stress and request a leave of absence from work.  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 Also with a change in management and her team lead has been very critical of any mistakes. She's making more mistakes due to her stress and the increased workload. She works from home and also travels to [place], has had to leave work due to increased stress and headaches from stress. She's had to call out of work due to these symptoms.   She's under a lot of stress with work, also grieving the fact that she cannot have another child which has hit her hard. She's struggling to stay afloat with work and her home life. She feels that she needs some time off from work to "re-group".   She is currently managed on sertraline 75 mg, feels that she may need an increase. She does feel as though Zoloft has been effective previously.   She would like for her leave to begin June 8th through July 5th with return date of July 6th. She has already discussed this with her HR department.    Review of Systems  Neurological: Positive for headaches.  Psychiatric/Behavioral: Positive for sleep disturbance. The patient is nervous/anxious.        See HPI         Past Medical History:  Diagnosis Date  . Depression   . Ectopic pregnancy   . Ectopic pregnancy, tubal 10/31/2010  . Migraines   . Ovarian cyst   . PCOS (polycystic ovarian syndrome)     Social History   Socioeconomic History  . Marital status: Single    Spouse name: Not on file  . Number of children: Not on file  . Years of education: Not on file  . Highest education level: Not on file  Occupational History  . Not on file  Tobacco Use   . Smoking status: Never Smoker  . Smokeless tobacco: Never Used  Substance and Sexual Activity  . Alcohol use: No  . Drug use: No  . Sexual activity: Yes  Other Topics Concern  . Not on file  Social History Narrative   Moved from [place], from [place].   Works with [employer].   Single.   1 child.    Social Determinants of Health   Financial Resource Strain: Not on file  Food Insecurity: Not on file  Transportation Needs: Not on file  Physical Activity: Not on file  Stress: Not on file  Social Connections: Not on file  Intimate Partner Violence: Not on file    Past Surgical History:  Procedure Laterality Date  . ABDOMINAL HYSTERECTOMY  2018   one ovary remaining  . WISDOM TOOTH EXTRACTION      Family History  Problem Relation Age of Onset  . Hypertension Mother   . Arthritis Mother   . Hypercholesterolemia Mother   . Hypertension Father   . Arthritis Father   . Hypercholesterolemia Father   . Arthritis Maternal Grandmother   . Bone cancer Paternal Grandmother     No Known Allergies  Current Outpatient Medications on File Prior to Visit  Medication Sig Dispense Refill  . amLODipine (NORVASC)  5 MG tablet Take 1 tablet (5 mg total) by mouth daily. For blood pressure. 90 tablet 1  . hydrochlorothiazide (HYDRODIURIL) 25 MG tablet Take one tablet daily for blood pressure. 90 tablet 0  . sertraline (ZOLOFT) 50 MG tablet Take 1.5-2 tablets (75-100 mg total) by mouth daily. For anxiety and depression 60 tablet 0  . SUMAtriptan (IMITREX) 50 MG tablet Take 1 tablet by mouth at migraine onset, may repeat with second tablet 2 hours later if migraine persists. 9 tablet 0  . topiramate (TOPAMAX) 50 MG tablet Take 1 tablet (50 mg total) by mouth at bedtime. For headache prevention. 90 tablet 0   No current facility-administered medications on file prior to visit.    BP 118/72   Pulse 82   Temp 98.2 F (36.8 C) (Temporal)   Ht 5\' 2"  (1.575 m)   Wt 148 lb (67.1 kg)    LMP 12/18/2013   SpO2 95%   BMI 27.07 kg/m  Objective:   Physical Exam Cardiovascular:     Rate and Rhythm: Normal rate and regular rhythm.  Pulmonary:     Effort: Pulmonary effort is normal.     Breath sounds: Normal breath sounds.  Musculoskeletal:     Cervical back: Neck supple.  Skin:    General: Skin is warm and dry.           Assessment & Plan:      This visit occurred during the SARS-CoV-2 public health emergency.  Safety protocols were in place, including screening questions prior to the visit, additional usage of staff PPE, and extensive cleaning of exam room while observing appropriate contact time as indicated for disinfecting solutions.

## 2020-09-09 NOTE — Telephone Encounter (Signed)
FMLA paperwork semi completed and placed in PCP's inbox for review, completion, sign and date 

## 2020-09-10 NOTE — Telephone Encounter (Signed)
Completed and placed on Tamera's desk 

## 2020-09-10 NOTE — Telephone Encounter (Signed)
Called pt to inform that FMLA paperwork is completed and faxed  Copy for pt pick up  Copy for scan   Copy retained by me

## 2020-09-16 ENCOUNTER — Ambulatory Visit (INDEPENDENT_AMBULATORY_CARE_PROVIDER_SITE_OTHER): Payer: BC Managed Care – PPO | Admitting: Psychology

## 2020-09-16 DIAGNOSIS — F411 Generalized anxiety disorder: Secondary | ICD-10-CM | POA: Diagnosis not present

## 2020-09-20 NOTE — Telephone Encounter (Signed)
April Bowen gave me additional paperwork and a request for office visit notes to be faxed  Fax sent  Copy for pt pick up  Copy for scan   Copy retained by me

## 2020-09-22 ENCOUNTER — Other Ambulatory Visit: Payer: Self-pay

## 2020-09-22 ENCOUNTER — Ambulatory Visit: Payer: BC Managed Care – PPO | Admitting: Psychology

## 2020-09-22 ENCOUNTER — Encounter: Payer: Self-pay | Admitting: Emergency Medicine

## 2020-09-22 ENCOUNTER — Ambulatory Visit
Admission: EM | Admit: 2020-09-22 | Discharge: 2020-09-22 | Disposition: A | Discharge status: Home / Self Care | Payer: BC Managed Care – PPO | Attending: Emergency Medicine | Admitting: Emergency Medicine

## 2020-09-22 DIAGNOSIS — M545 Low back pain, unspecified: Secondary | ICD-10-CM | POA: Diagnosis not present

## 2020-09-22 HISTORY — DX: Essential (primary) hypertension: I10

## 2020-09-22 MED ORDER — METHOCARBAMOL 500 MG PO TABS: 500.0000 mg | ORAL_TABLET | Freq: Two times a day (BID) | ORAL | 0 refills | Status: DC | PRN

## 2020-09-22 MED ORDER — IBUPROFEN 600 MG PO TABS: 600.0000 mg | ORAL_TABLET | Freq: Four times a day (QID) | ORAL | 0 refills | Status: DC | PRN

## 2020-09-22 NOTE — ED Triage Notes (Signed)
Patient c/o bilateral lower back pain x 1 day.   Patient denies falls.   Patient states " I went to pick up my daughter yesterday and she jumped before I was ready and that's when it started".   Patient endorses difficulty bending down.   Patient endorses pain is worsened when sitting and walking.   Patient used Advil and a heating pad with no relief of symptoms.

## 2020-09-22 NOTE — ED Provider Notes (Signed)
Renaldo Fiddler    CSN: [id number] Arrival date & time: 09/22/20  1359      History   Chief Complaint Chief Complaint  Patient presents with   Back Pain    HPI April Bowen is a 35 y.o. female.  Patient presents with bilateral lower back pain since yesterday.  The pain started after she picked up her daughter.  It is worse with movement, ambulation, sitting.  It improves with lying down flat.  Nonradiating.  She denies numbness, weakness, paresthesias, loss of bowel/bladder control, saddle anesthesia, abdominal pain, dysuria, fever, or other symptoms.  Treatment at home with ibuprofen and heating pad.  Her medical history includes adjustment disorder with mixed anxiety and depressed mood, chronic migraine headaches, hypertension, PCOS.  The history is provided by the patient and medical records.   Past Medical History:  Diagnosis Date   Depression    Ectopic pregnancy    Ectopic pregnancy, tubal 10/31/2010   Hypertension    Migraines    Ovarian cyst    PCOS (polycystic ovarian syndrome)     Patient Active Problem List   Diagnosis Date Noted   Epistaxis 05/20/2020   Sore throat 07/17/2019   Encounter for annual general medical examination with abnormal findings in adult 05/19/2019   Adjustment disorder with mixed anxiety and depressed mood 06/14/2018   Essential hypertension 05/20/2018   Foul smelling urine 05/20/2018   Migraines 05/20/2018    Past Surgical History:  Procedure Laterality Date   ABDOMINAL HYSTERECTOMY  2018   one ovary remaining   WISDOM TOOTH EXTRACTION      OB History     Gravida  1   Para      Term      Preterm      AB  1   Living         SAB      IAB      Ectopic  1   Multiple      Live Births               Home Medications    Prior to Admission medications   Medication Sig Start Date End Date Taking? Authorizing Provider  amLODipine (NORVASC) 5 MG tablet Take 1 tablet (5 mg total) by mouth daily. For  blood pressure. 04/14/20  Yes Doreene Nest, NP  hydrochlorothiazide (HYDRODIURIL) 25 MG tablet Take one tablet daily for blood pressure. 04/12/20  Yes Doreene Nest, NP  ibuprofen (ADVIL) 600 MG tablet Take 1 tablet (600 mg total) by mouth every 6 (six) hours as needed. 09/22/20  Yes Mickie Bail, NP  methocarbamol (ROBAXIN) 500 MG tablet Take 1 tablet (500 mg total) by mouth 2 (two) times daily as needed for muscle spasms. 09/22/20  Yes Mickie Bail, NP  sertraline (ZOLOFT) 100 MG tablet Take 1 tablet (100 mg total) by mouth daily. For anxiety and depression. 09/07/20  Yes Doreene Nest, NP  topiramate (TOPAMAX) 50 MG tablet Take 1 tablet (50 mg total) by mouth at bedtime. For headache prevention. 04/14/20  Yes Doreene Nest, NP  SUMAtriptan (IMITREX) 50 MG tablet Take 1 tablet by mouth at migraine onset, may repeat with second tablet 2 hours later if migraine persists. 06/22/20   Doreene Nest, NP    Family History Family History  Problem Relation Age of Onset   Hypertension Mother    Arthritis Mother    Hypercholesterolemia Mother    Hypertension Father  Arthritis Father    Hypercholesterolemia Father    Arthritis Maternal Grandmother    Bone cancer Paternal Grandmother     Social History Social History   Tobacco Use   Smoking status: Never   Smokeless tobacco: Never  Substance Use Topics   Alcohol use: No   Drug use: No     Allergies   Patient has no known allergies.   Review of Systems Review of Systems  Constitutional:  Negative for chills and fever.  Respiratory:  Negative for cough and shortness of breath.   Cardiovascular:  Negative for chest pain and palpitations.  Gastrointestinal:  Negative for abdominal pain and vomiting.  Genitourinary:  Negative for dysuria and hematuria.  Musculoskeletal:  Positive for back pain. Negative for gait problem.  Skin:  Negative for color change and rash.  Neurological:  Negative for weakness and  numbness.  All other systems reviewed and are negative.   Physical Exam Triage Vital Signs ED Triage Vitals  Enc Vitals Group     BP      Pulse      Resp      Temp      Temp src      SpO2      Weight      Height      Head Circumference      Peak Flow      Pain Score      Pain Loc      Pain Edu?      Excl. in GC?    No data found.  Updated Vital Signs BP (!) 127/94 (BP Location: Left Arm)   Pulse 97   Temp 98.7 F (37.1 C) (Oral)   Resp 18   LMP 12/18/2013   SpO2 98%   Visual Acuity Right Eye Distance:   Left Eye Distance:   Bilateral Distance:    Right Eye Near:   Left Eye Near:    Bilateral Near:     Physical Exam Vitals and nursing note reviewed.  Constitutional:      General: She is not in acute distress.    Appearance: She is well-developed. She is not ill-appearing.  HENT:     Head: Normocephalic and atraumatic.     Mouth/Throat:     Mouth: Mucous membranes are moist.  Eyes:     Conjunctiva/sclera: Conjunctivae normal.  Cardiovascular:     Rate and Rhythm: Normal rate and regular rhythm.     Heart sounds: Normal heart sounds.  Pulmonary:     Effort: Pulmonary effort is normal. No respiratory distress.     Breath sounds: Normal breath sounds.  Abdominal:     Palpations: Abdomen is soft.     Tenderness: There is no abdominal tenderness. There is no right CVA tenderness, left CVA tenderness, guarding or rebound.  Musculoskeletal:        General: No swelling, tenderness, deformity or signs of injury. Normal range of motion.     Cervical back: Neck supple.  Skin:    General: Skin is warm and dry.     Findings: No bruising, erythema, lesion or rash.  Neurological:     General: No focal deficit present.     Mental Status: She is alert and oriented to person, place, and time.     Sensory: No sensory deficit.     Motor: No weakness.     Gait: Gait normal.  Psychiatric:        Mood and Affect: Mood normal.  Behavior: Behavior normal.      UC Treatments / Results  Labs (all labs ordered are listed, but only abnormal results are displayed) Labs Reviewed - No data to display  EKG   Radiology No results found.  Procedures Procedures (including critical care time)  Medications Ordered in UC Medications - No data to display  Initial Impression / Assessment and Plan / UC Course  I have reviewed the triage vital signs and the nursing notes.  Pertinent labs & imaging results that were available during my care of the patient were reviewed by me and considered in my medical decision making (see chart for details).    Acute low back pain without sciatica.  Treating with ibuprofen and Robaxin.  Precautions for drowsiness with Robaxin discussed.  Instructed patient to follow-up with her PCP if her symptoms are not improving.  Education provided on acute back pain.  Patient agrees to plan of care.      Final Clinical Impressions(s) / UC Diagnoses   Final diagnoses:  Acute bilateral low back pain without sciatica     Discharge Instructions      Take ibuprofen as needed for discomfort.  Take the muscle relaxer as needed for muscle spasm; Do not drive, operate machinery, or drink alcohol with this medication as it can cause drowsiness.   Follow up with your primary care provider if your symptoms are not improving.         ED Prescriptions     Medication Sig Dispense Auth. Provider   methocarbamol (ROBAXIN) 500 MG tablet Take 1 tablet (500 mg total) by mouth 2 (two) times daily as needed for muscle spasms. 10 tablet Mickie Bail, NP   ibuprofen (ADVIL) 600 MG tablet Take 1 tablet (600 mg total) by mouth every 6 (six) hours as needed. 30 tablet Mickie Bail, NP      I have reviewed the PDMP during this encounter.   Mickie Bail, NP 09/22/20 1440

## 2020-09-22 NOTE — Discharge Instructions (Addendum)
Take ibuprofen as needed for discomfort.    Take the muscle relaxer as needed for muscle spasm; Do not drive, operate machinery, or drink alcohol with this medication as it can cause drowsiness.   Follow up with your primary care provider if your symptoms are not improving.     

## 2020-09-23 NOTE — Telephone Encounter (Signed)
Called pt and told her I would refax office notes from 09/07/2020  I have retained fax transmission sheet again as always for proof it was sent

## 2020-10-05 ENCOUNTER — Ambulatory Visit: Payer: BC Managed Care – PPO | Admitting: Primary Care

## 2020-10-05 ENCOUNTER — Encounter: Payer: Self-pay | Admitting: Primary Care

## 2020-10-05 ENCOUNTER — Other Ambulatory Visit: Payer: Self-pay

## 2020-10-05 DIAGNOSIS — F4323 Adjustment disorder with mixed anxiety and depressed mood: Secondary | ICD-10-CM

## 2020-10-05 NOTE — Assessment & Plan Note (Signed)
Improving from dose increase of sertraline to 100 mg and also with medical leave from work.  Agree to extend medical leave through July 26th with return date of July 27th. Will update medical forms.   Continue with therapy, continue sertraline 100 mg.

## 2020-10-05 NOTE — Progress Notes (Signed)
Subjective:    Patient ID: Kathryne Hitch, female    DOB: [birth year], 35 y.o.   MRN: [id number]  HPI  Kiora D Belleau is a very pleasant 35 y.o. female with a history of hypertension, migraines, anxiety and depression who presents today for follow up of medical leave.  She was last evaluated in early June 2022 with symptoms of anxiety and depression, increased workload at work, increased stress with Writer, making more mistakes at work due to stress. During this visit her sertraline was increase to 100 mg and we agreed to removing her from work with a return date of July 6th.  Today she's noticed an improvement with the increased dose of her sertraline to 100 mg, does have good days and bad days. 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 Positive effects since her last visit include feeling less anxious, increased motivation to do things, doing more with her daughter, is walking daily.   She had one session with her therapist, is working to get in with a female as she feels more comfortable with a female. She is not ready to return to work, would like an extension with a tentative return date of July 27th. She may decide to return sooner.   BP Readings from Last 3 Encounters:  10/05/20 124/82  09/22/20 (!) 127/94  09/07/20 118/72     Review of Systems  Psychiatric/Behavioral:  The patient is nervous/anxious.        See HPI        Past Medical History:  Diagnosis Date   Depression    Ectopic pregnancy    Ectopic pregnancy, tubal 10/31/2010   Hypertension    Migraines    Ovarian cyst    PCOS (polycystic ovarian syndrome)     Social History   Socioeconomic History   Marital status: Single    Spouse name: Not on file   Number of children: Not on file   Years of education: Not on file   Highest education level: Not on file  Occupational History   Not  on file  Tobacco Use   Smoking status: Never   Smokeless tobacco: Never  Vaping Use   Vaping Use: Never used  Substance and Sexual Activity   Alcohol use: No   Drug use: No   Sexual activity: Yes  Other Topics Concern   Not on file  Social History Narrative   Moved from [place], from [place].   Works with [employer].   Single.   1 child.    Social Determinants of Health   Financial Resource Strain: Not on file  Food Insecurity: Not on file  Transportation Needs: Not on file  Physical Activity: Not on file  Stress: Not on file  Social Connections: Not on file  Intimate Partner Violence: Not on file    Past Surgical History:  Procedure Laterality Date   ABDOMINAL HYSTERECTOMY  2018   one ovary remaining   WISDOM TOOTH EXTRACTION      Family History  Problem Relation Age of Onset   Hypertension Mother    Arthritis Mother    Hypercholesterolemia Mother    Hypertension Father    Arthritis Father    Hypercholesterolemia Father    Arthritis Maternal Grandmother    Bone cancer Paternal Grandmother     No Known Allergies  Current Outpatient Medications on File  Prior to Visit  Medication Sig Dispense Refill   amLODipine (NORVASC) 5 MG tablet Take 1 tablet (5 mg total) by mouth daily. For blood pressure. 90 tablet 1   hydrochlorothiazide (HYDRODIURIL) 25 MG tablet Take one tablet daily for blood pressure. 90 tablet 0   sertraline (ZOLOFT) 100 MG tablet Take 1 tablet (100 mg total) by mouth daily. For anxiety and depression. 90 tablet 1   SUMAtriptan (IMITREX) 50 MG tablet Take 1 tablet by mouth at migraine onset, may repeat with second tablet 2 hours later if migraine persists. 9 tablet 0   topiramate (TOPAMAX) 50 MG tablet Take 1 tablet (50 mg total) by mouth at bedtime. For headache prevention. 90 tablet 0   ibuprofen (ADVIL) 600 MG tablet Take 1 tablet (600 mg total) by mouth every 6 (six) hours as needed. (Patient not taking: Reported on 10/05/2020) 30 tablet 0    methocarbamol (ROBAXIN) 500 MG tablet Take 1 tablet (500 mg total) by mouth 2 (two) times daily as needed for muscle spasms. (Patient not taking: Reported on 10/05/2020) 10 tablet 0   No current facility-administered medications on file prior to visit.    BP 124/82   Pulse 75   Temp 97.8 F (36.6 C) (Temporal)   Ht 5\' 2"  (1.575 m)   Wt 148 lb 1.9 oz (67.2 kg)   LMP 12/18/2013   SpO2 94%   BMI 27.09 kg/m  Objective:   Physical Exam Cardiovascular:     Rate and Rhythm: Normal rate and regular rhythm.  Pulmonary:     Effort: Pulmonary effort is normal.     Breath sounds: Normal breath sounds.  Musculoskeletal:     Cervical back: Neck supple.  Skin:    General: Skin is warm and dry.          Assessment & Plan:      This visit occurred during the SARS-CoV-2 public health emergency.  Safety protocols were in place, including screening questions prior to the visit, additional usage of staff PPE, and extensive cleaning of exam room while observing appropriate contact time as indicated for disinfecting solutions.

## 2020-10-05 NOTE — Patient Instructions (Signed)
We will extend your leave through July 26th with a return date of July 27th.  Continue sertraline 100 mg daily for anxiety and depression.  It was a pleasure to see you today!

## 2020-10-06 NOTE — Telephone Encounter (Signed)
FMLA dates extension paperwork completed and faxed  Called pt to inform   Copy mailed to pt  Copy to scan   Copy retained by me

## 2020-10-07 NOTE — Telephone Encounter (Signed)
Office notes from 10/05/2020 faxed to [facility]

## 2020-10-19 NOTE — Telephone Encounter (Signed)
Please advise 

## 2020-10-21 ENCOUNTER — Telehealth: Payer: Self-pay

## 2020-10-21 NOTE — Telephone Encounter (Signed)
Tam, FYI.

## 2020-10-21 NOTE — Telephone Encounter (Signed)
LVM for pt to call me about short term disability claim issue.

## 2020-10-21 NOTE — Telephone Encounter (Signed)
Tam, can you take a look? Why did they deny her claim for FMLA?

## 2020-11-19 ENCOUNTER — Other Ambulatory Visit: Payer: Self-pay

## 2020-11-19 ENCOUNTER — Telehealth: Payer: Self-pay | Admitting: Primary Care

## 2020-11-19 DIAGNOSIS — I10 Essential (primary) hypertension: Secondary | ICD-10-CM

## 2020-11-19 MED ORDER — HYDROCHLOROTHIAZIDE 25 MG PO TABS: ORAL_TABLET | ORAL | 0 refills | Status: DC

## 2020-11-19 NOTE — Telephone Encounter (Signed)
  Encourage patient to contact the pharmacy for refills or they can request refills through [facility]  LAST APPOINTMENT DATE:  Please schedule appointment if longer than 1 year  NEXT APPOINTMENT DATE:  MEDICATION: hydrochlorothiazide (HYDRODIURIL) 25 MG tablet  Is the patient out of medication? yes  PHARMACY: [facility] [address]  Let patient know to contact pharmacy at the end of the day to make sure medication is ready.  Please notify patient to allow 48-72 hours to process  CLINICAL FILLS OUT ALL BELOW:   LAST REFILL:  QTY:  REFILL DATE:    OTHER COMMENTS:    Okay for refill?  Please advise

## 2020-11-19 NOTE — Telephone Encounter (Signed)
Refill sent in

## 2021-02-03 ENCOUNTER — Other Ambulatory Visit: Payer: Self-pay | Admitting: Primary Care

## 2021-02-03 DIAGNOSIS — G43709 Chronic migraine without aura, not intractable, without status migrainosus: Secondary | ICD-10-CM

## 2021-02-03 NOTE — Telephone Encounter (Signed)
LAST APPOINTMENT DATE: 806/10/2020  NEXT APPOINTMENT DATE: Visit date not found    LAST REFILL: 06/22/2020  QTY: 9 with no rf

## 2021-02-03 NOTE — Telephone Encounter (Signed)
Is a refill request for topiramate or for sumatriptan?  You have queued up topiramate, but I suspect she is requesting sumatriptan.  Just want to clarify.  It looks like she may be out of topiramate 50 mg, has she been taking this?  I provided a 90-day supply refill in January 2022, no attached refills.

## 2021-02-08 ENCOUNTER — Other Ambulatory Visit: Payer: Self-pay

## 2021-02-08 DIAGNOSIS — I10 Essential (primary) hypertension: Secondary | ICD-10-CM

## 2021-02-08 NOTE — Telephone Encounter (Signed)
LAST APPOINTMENT [id number]

## 2021-02-08 NOTE — Telephone Encounter (Signed)
Left message to return call to our office.  

## 2021-02-08 NOTE — Telephone Encounter (Signed)
Was not able to reach patient but new request was received for Sumatriptan

## 2021-02-09 MED ORDER — HYDROCHLOROTHIAZIDE 25 MG PO TABS: 25.0000 mg | ORAL_TABLET | Freq: Every day | ORAL | 0 refills | Status: DC

## 2021-02-17 LAB — HM PAP SMEAR: HPV, high-risk: NEGATIVE

## 2021-02-21 ENCOUNTER — Other Ambulatory Visit: Payer: Self-pay

## 2021-02-21 DIAGNOSIS — G43709 Chronic migraine without aura, not intractable, without status migrainosus: Secondary | ICD-10-CM

## 2021-02-21 MED ORDER — SUMATRIPTAN SUCCINATE 50 MG PO TABS: ORAL_TABLET | ORAL | 0 refills | Status: DC

## 2021-02-21 NOTE — Telephone Encounter (Signed)
LAST APPOINTMENT DATE:   NEXT APPOINTMENT DATE: Visit date not found    LAST REFILL: 10/05/2020   QTY: #9 no refill

## 2021-04-18 ENCOUNTER — Other Ambulatory Visit: Payer: Self-pay | Admitting: Primary Care

## 2021-04-18 DIAGNOSIS — F4323 Adjustment disorder with mixed anxiety and depressed mood: Secondary | ICD-10-CM

## 2021-05-23 ENCOUNTER — Encounter: Payer: BC Managed Care – PPO | Admitting: Primary Care

## 2021-05-23 ENCOUNTER — Telehealth: Payer: Self-pay | Admitting: *Deleted

## 2021-05-23 NOTE — Telephone Encounter (Signed)
PLEASE NOTE: All timestamps contained within this report are represented as [place] Standard Time. CONFIDENTIALTY NOTICE: This fax transmission is intended only for the addressee. It contains information that is legally privileged, confidential or otherwise protected from use or disclosure. If you are not the intended recipient, you are strictly prohibited from reviewing, disclosing, copying using or disseminating any of this information or taking any action in reliance on or regarding this information. If you have received this fax in error, please notify us immediately by telephone so that we can arrange for its return to [place]. Phone: [phone], Toll-Free: [phone], Fax: [phone] Page: 1 of 1 Call Id: [id number] [facility] Night - Client Nonclinical Telephone Record  [facility] Client [facility] Night - Client Client Site [facility] - Night Provider Vernona Rieger - NP Contact Type Call Who Is Calling Patient / Member / Family / Caregiver Caller Name April Bowen Phone Number [phone] Patient Name April Bowen Patient DOB Feb 16, 1986 Call Type Message Only Information Provided Reason for Call Request to Cancel Office Appointment Initial Comment Caller states she is needing to cancel her appointment due to sick child Patient request to speak to RN No Additional Comment Provided office hours Disp. Time Disposition Final User 05/23/2021 7:37:36 AM General Information Provided Yes Talmadge Coventry Call Closed By: Talmadge Coventry Transaction Date/Time: 05/23/2021 7:35:00 AM (ET)

## 2021-05-23 NOTE — Telephone Encounter (Signed)
Cancelled appt

## 2021-06-09 ENCOUNTER — Other Ambulatory Visit: Payer: Self-pay | Admitting: Primary Care

## 2021-06-09 DIAGNOSIS — G43709 Chronic migraine without aura, not intractable, without status migrainosus: Secondary | ICD-10-CM

## 2021-07-18 ENCOUNTER — Other Ambulatory Visit: Payer: Self-pay | Admitting: Primary Care

## 2021-07-18 DIAGNOSIS — F4323 Adjustment disorder with mixed anxiety and depressed mood: Secondary | ICD-10-CM

## 2021-07-21 ENCOUNTER — Encounter: Payer: Self-pay | Admitting: Primary Care

## 2021-07-21 ENCOUNTER — Ambulatory Visit (INDEPENDENT_AMBULATORY_CARE_PROVIDER_SITE_OTHER): Payer: BC Managed Care – PPO | Admitting: Primary Care

## 2021-07-21 VITALS — BP 118/78 | HR 97 | Temp 98.2°F | Ht 62.0 in | Wt 143.0 lb

## 2021-07-21 DIAGNOSIS — G43709 Chronic migraine without aura, not intractable, without status migrainosus: Secondary | ICD-10-CM | POA: Diagnosis not present

## 2021-07-21 DIAGNOSIS — I10 Essential (primary) hypertension: Secondary | ICD-10-CM | POA: Diagnosis not present

## 2021-07-21 DIAGNOSIS — F4323 Adjustment disorder with mixed anxiety and depressed mood: Secondary | ICD-10-CM | POA: Diagnosis not present

## 2021-07-21 DIAGNOSIS — R002 Palpitations: Secondary | ICD-10-CM | POA: Diagnosis not present

## 2021-07-21 DIAGNOSIS — Z0001 Encounter for general adult medical examination with abnormal findings: Secondary | ICD-10-CM | POA: Diagnosis not present

## 2021-07-21 LAB — CBC
HCT: 39.9 % (ref 36.0–46.0)
Hemoglobin: 13.3 g/dL (ref 12.0–15.0)
MCHC: 33.5 g/dL (ref 30.0–36.0)
MCV: 93.7 fl (ref 78.0–100.0)
Platelets: 186 10*3/uL (ref 150.0–400.0)
RBC: 4.25 Mil/uL (ref 3.87–5.11)
RDW: 13.5 % (ref 11.5–15.5)
WBC: 6.5 10*3/uL (ref 4.0–10.5)

## 2021-07-21 LAB — COMPREHENSIVE METABOLIC PANEL
ALT: 8 U/L (ref 0–35)
AST: 12 U/L (ref 0–37)
Albumin: 4.7 g/dL (ref 3.5–5.2)
Alkaline Phosphatase: 50 U/L (ref 39–117)
BUN: 10 mg/dL (ref 6–23)
CO2: 29 mEq/L (ref 19–32)
Calcium: 9.3 mg/dL (ref 8.4–10.5)
Chloride: 104 mEq/L (ref 96–112)
Creatinine, Ser: 0.78 mg/dL (ref 0.40–1.20)
GFR: 97.89 mL/min (ref >60.00)
Glucose, Bld: 93 mg/dL (ref 70–99)
Potassium: 3.8 mEq/L (ref 3.5–5.1)
Sodium: 140 mEq/L (ref 135–145)
Total Bilirubin: 0.9 mg/dL (ref 0.2–1.2)
Total Protein: 7.5 g/dL (ref 6.0–8.3)

## 2021-07-21 LAB — LIPID PANEL
Cholesterol: 144 mg/dL (ref 0–200)
HDL: 59.6 mg/dL (ref >39.00)
LDL Cholesterol: 75 mg/dL (ref 0–99)
NonHDL: 84.59
Total CHOL/HDL Ratio: 2
Triglycerides: 50 mg/dL (ref 0.0–149.0)
VLDL: 10 mg/dL (ref 0.0–40.0)

## 2021-07-21 LAB — TSH: TSH: 1.42 u[IU]/mL (ref 0.35–5.50)

## 2021-07-21 MED ORDER — TOPIRAMATE 50 MG PO TABS: 50.0000 mg | ORAL_TABLET | Freq: Two times a day (BID) | ORAL | 0 refills | Status: DC

## 2021-07-21 MED ORDER — SUMATRIPTAN SUCCINATE 100 MG PO TABS: 100.0000 mg | ORAL_TABLET | ORAL | 0 refills | Status: DC | PRN

## 2021-07-21 NOTE — Assessment & Plan Note (Signed)
Controlled. ? ?Continue amlodipine 5 mg daily, hydrochlorothiazide 25 mg daily. ? ?CMP due and pending. ?

## 2021-07-21 NOTE — Assessment & Plan Note (Signed)
Controlled.  Continue sertraline 100 mg daily.   

## 2021-07-21 NOTE — Patient Instructions (Signed)
Stop by the lab prior to leaving today. I will notify you of your results once received.  ? ?You will be contacted regarding your referral to neurology and cardiology.  Please let us know if you have not been contacted within two weeks.  ? ?We increased the dose of your topiramate for headache prevention.  Take 1 tablet by mouth twice daily. ? ?We increased the dose of your sumatriptan for headache/migraine abortion.  Take 1 tablet by mouth at migraine onset, may take second tablet 2 hours later if needed.  Do not exceed 2 tablets in 24 hours. ? ?It was a pleasure to see you today! ? ?Preventive Care 10-36 Years Old, Female ?Preventive care refers to lifestyle choices and visits with your health care provider that can promote health and wellness. Preventive care visits are also called wellness exams. ?What can I expect for my preventive care visit? ?Counseling ?During your preventive care visit, your health care provider may ask about your: ?Medical history, including: ?Past medical problems. ?Family medical history. ?Pregnancy history. ?Current health, including: ?Menstrual cycle. ?Method of birth control. ?Emotional well-being. ?Home life and relationship well-being. ?Sexual activity and sexual health. ?Lifestyle, including: ?Alcohol, nicotine or tobacco, and drug use. ?Access to firearms. ?Diet, exercise, and sleep habits. ?Work and work Statistician. ?Sunscreen use. ?Safety issues such as seatbelt and bike helmet use. ?Physical exam ?Your health care provider may check your: ?Height and weight. These may be used to calculate your BMI (body mass index). BMI is a measurement that tells if you are at a healthy weight. ?Waist circumference. This measures the distance around your waistline. This measurement also tells if you are at a healthy weight and may help predict your risk of certain diseases, such as type 2 diabetes and high blood pressure. ?Heart rate and blood pressure. ?Body temperature. ?Skin for abnormal  spots. ?What immunizations do I need? ? ?Vaccines are usually given at various ages, according to a schedule. Your health care provider will recommend vaccines for you based on your age, medical history, and lifestyle or other factors, such as travel or where you work. ?What tests do I need? ?Screening ?Your health care provider may recommend screening tests for certain conditions. This may include: ?Pelvic exam and Pap test. ?Lipid and cholesterol levels. ?Diabetes screening. This is done by checking your blood sugar (glucose) after you have not eaten for a while (fasting). ?Hepatitis B test. ?Hepatitis C test. ?HIV (human immunodeficiency virus) test. ?STI (sexually transmitted infection) testing, if you are at risk. ?BRCA-related cancer screening. This may be done if you have a family history of breast, ovarian, tubal, or peritoneal cancers. ?Talk with your health care provider about your test results, treatment options, and if necessary, the need for more tests. ?Follow these instructions at home: ?Eating and drinking ? ?Eat a healthy diet that includes fresh fruits and vegetables, whole grains, lean protein, and low-fat dairy products. ?Take vitamin and mineral supplements as recommended by your health care provider. ?Do not drink alcohol if: ?Your health care provider tells you not to drink. ?You are pregnant, may be pregnant, or are planning to become pregnant. ?If you drink alcohol: ?Limit how much you have to 0-1 drink a day. ?Know how much alcohol is in your drink. In the [place] one drink equals one 12 oz bottle of beer (355 mL), one 5 oz glass of wine (148 mL), or one 1? oz glass of hard liquor (44 mL). ?Lifestyle ?Brush your teeth every morning and night with  fluoride toothpaste. Floss one time each day. ?Exercise for at least 30 minutes 5 or more days each week. ?Do not use any products that contain nicotine or tobacco. These products include cigarettes, chewing tobacco, and vaping devices, such as  e-cigarettes. If you need help quitting, ask your health care provider. ?Do not use drugs. ?If you are sexually active, practice safe sex. Use a condom or other form of protection to prevent STIs. ?If you do not wish to become pregnant, use a form of birth control. If you plan to become pregnant, see your health care provider for a prepregnancy visit. ?Find healthy ways to manage stress, such as: ?Meditation, yoga, or listening to music. ?Journaling. ?Talking to a trusted person. ?Spending time with friends and family. ?Minimize exposure to UV radiation to reduce your risk of skin cancer. ?Safety ?Always wear your seat belt while driving or riding in a vehicle. ?Do not drive: ?If you have been drinking alcohol. Do not ride with someone who has been drinking. ?If you have been using any mind-altering substances or drugs. ?While texting. ?When you are tired or distracted. ?Wear a helmet and other protective equipment during sports activities. ?If you have firearms in your house, make sure you follow all gun safety procedures. ?Seek help if you have been physically or sexually abused. ?What's next? ?Go to your health care provider once a year for an annual wellness visit. ?Ask your health care provider how often you should have your eyes and teeth checked. ?Stay up to date on all vaccines. ?This information is not intended to replace advice given to you by your health care provider. Make sure you discuss any questions you have with your health care provider. ?Document Revised: 09/15/2020 Document Reviewed: 09/15/2020 ?Elsevier Patient Education ? [place]. ? ?

## 2021-07-21 NOTE — Assessment & Plan Note (Addendum)
Recommended she reduce caffeine consumption. ? ?Checking labs today. ? ?Referral placed to cardiology. ? ?ECG today with normal sinus rhythm with rate of 92, no PAC/PVC, no acute ST changes.  ?No old ECG  to compare. ?

## 2021-07-21 NOTE — Progress Notes (Signed)
? ?Subjective:  ? ? Patient ID: April Bowen, female    DOB: 1986-02-22, 36 y.o.   MRN: [id number] ? ?HPI ? ?April Bowen is a very pleasant 36 y.o. female who presents today for complete physical and follow up of chronic conditions. ? ?She would also like to discuss migraines. She is in a new position at work, is working on a computer more frequently, is no longer working form home. Migraines are occurring once weekly which begin to the base of the neck and radiate up to parietal and frontal lobes. Currently managed on Topamax 50 mg HS for headache prevention and sumatriptan 50 mg PRN for abortive treatment. Symptoms began 3-4 months ago. Sumatriptan 50 mg isn't effective, always has to take 2 tablets to get some relief. Anxiety has been controlled. She denies neck pain.  ? ?She would also like to mention heart flutters. Heart flutters are intermittent, occurring twice monthly and last for about 15 minutes, mostly notices them at night.This began around Christmas 2022. She denies increased anxiety, new supplements or medications. She has increased her intake of coffee, 2-3 cups daily with last cup being around lunchtime.  ? ?Immunizations: ?-Tetanus: 2018 ?-Influenza: Did not complete ?-Covid-19: 3 vaccines ? ?Diet: Fair diet.  ?Exercise: No regular exercise. ? ?Eye exam: Completes annually  ?Dental exam: Completes semi-annually  ? ?Pap Smear: Completed partial hysterectomy, one ovary remaining.  ? ?BP Readings from Last 3 Encounters:  ?07/21/21 118/78  ?10/05/20 124/82  ?09/22/20 (!) 127/94  ? ? ? ? ? ?Review of Systems  ?Constitutional:  Negative for unexpected weight change.  ?HENT:  Negative for rhinorrhea.   ?Eyes:  Positive for visual disturbance.  ?Respiratory:  Negative for cough and shortness of breath.   ?Cardiovascular:  Negative for chest pain.  ?     Heart fluttering   ?Gastrointestinal:  Negative for constipation and diarrhea.  ?Genitourinary:  Negative for difficulty urinating and menstrual  problem.  ?Musculoskeletal:  Negative for arthralgias, myalgias and neck pain.  ?Skin:  Negative for rash.  ?Allergic/Immunologic: Negative for environmental allergies.  ?Neurological:  Positive for headaches. Negative for dizziness.  ?Psychiatric/Behavioral:  The patient is not nervous/anxious.   ? ?   ? ? ?Past Medical History:  ?Diagnosis Date  ? Depression   ? Ectopic pregnancy   ? Ectopic pregnancy, tubal 10/31/2010  ? Epistaxis 05/20/2020  ? Hypertension   ? Migraines   ? Ovarian cyst   ? PCOS (polycystic ovarian syndrome)   ? ? ?Social History  ? ?Socioeconomic History  ? Marital status: Single  ?  Spouse name: Not on file  ? Number of children: Not on file  ? Years of education: Not on file  ? Highest education level: Not on file  ?Occupational History  ? Not on file  ?Tobacco Use  ? Smoking status: Never  ? Smokeless tobacco: Never  ?Vaping Use  ? Vaping Use: Never used  ?Substance and Sexual Activity  ? Alcohol use: No  ? Drug use: No  ? Sexual activity: Yes  ?Other Topics Concern  ? Not on file  ?Social History Narrative  ? Moved from [place], from [place].  ? Works with [employer].  ? Single.  ? 1 child.   ? ?Social Determinants of Health  ? ?Financial Resource Strain: Not on file  ?Food Insecurity: Not on file  ?Transportation Needs: Not on file  ?Physical Activity: Not on file  ?Stress: Not on file  ?Social Connections: Not on file  ?  Intimate Partner Violence: Not on file  ? ? ?Past Surgical History:  ?Procedure Laterality Date  ? ABDOMINAL HYSTERECTOMY  2018  ? one ovary remaining  ? WISDOM TOOTH EXTRACTION    ? ? ?Family History  ?Problem Relation Age of Onset  ? Hypertension Mother   ? Arthritis Mother   ? Hypercholesterolemia Mother   ? Hypertension Father   ? Arthritis Father   ? Hypercholesterolemia Father   ? Arthritis Maternal Grandmother   ? Bone cancer Paternal Grandmother   ? ? ?No Known Allergies ? ?Current Outpatient Medications on File Prior to Visit  ?Medication Sig Dispense Refill  ?  amLODipine (NORVASC) 5 MG tablet Take 1 tablet (5 mg total) by mouth daily. For blood pressure. 90 tablet 1  ? hydrochlorothiazide (HYDRODIURIL) 25 MG tablet Take 1 tablet (25 mg total) by mouth daily. for blood pressure. Due in late February for office visit. 90 tablet 0  ? methocarbamol (ROBAXIN) 500 MG tablet Take 1 tablet (500 mg total) by mouth 2 (two) times daily as needed for muscle spasms. 10 tablet 0  ? sertraline (ZOLOFT) 100 MG tablet TAKE 1 TABLET DAILY FOR ANXIETY AND DEPRESSION 90 tablet 0  ? ?No current facility-administered medications on file prior to visit.  ? ? ?BP 118/78 (BP Location: Left Arm, Patient Position: Sitting, Cuff Size: Normal)   Pulse 97   Temp 98.2 ?F (36.8 ?C) (Temporal)   Ht 5\' 2"  (1.575 m)   Wt 143 lb (64.9 kg)   LMP 12/18/2013   SpO2 97%   BMI 26.16 kg/m?  ?Objective:  ? Physical Exam ?HENT:  ?   Right Ear: Tympanic membrane and ear canal normal.  ?   Left Ear: Tympanic membrane and ear canal normal.  ?   Nose: Nose normal.  ?Eyes:  ?   Conjunctiva/sclera: Conjunctivae normal.  ?   Pupils: Pupils are equal, round, and reactive to light.  ?Neck:  ?   Thyroid: No thyromegaly.  ?Cardiovascular:  ?   Rate and Rhythm: Normal rate and regular rhythm.  ?   Heart sounds: No murmur heard. ?Pulmonary:  ?   Effort: Pulmonary effort is normal.  ?   Breath sounds: Normal breath sounds. No rales.  ?Abdominal:  ?   General: Bowel sounds are normal.  ?   Palpations: Abdomen is soft.  ?   Tenderness: There is no abdominal tenderness.  ?Musculoskeletal:     ?   General: Normal range of motion.  ?   Cervical back: Neck supple.  ?Lymphadenopathy:  ?   Cervical: No cervical adenopathy.  ?Skin: ?   General: Skin is warm and dry.  ?   Findings: No rash.  ?Neurological:  ?   Mental Status: She is alert and oriented to person, place, and time.  ?   Cranial Nerves: No cranial nerve deficit.  ?   Deep Tendon Reflexes: Reflexes are normal and symmetric.  ?Psychiatric:     ?   Mood and Affect: Mood  normal.  ? ? ? ? ? ?   ?Assessment & Plan:  ? ? ? ? ?This visit occurred during the SARS-CoV-2 public health emergency.  Safety protocols were in place, including screening questions prior to the visit, additional usage of staff PPE, and extensive cleaning of exam room while observing appropriate contact time as indicated for disinfecting solutions.  ?

## 2021-07-21 NOTE — Assessment & Plan Note (Signed)
Immunizations up-to-date. ?History of partial hysterectomy, 1 ovary remaining. ? ?Encouraged healthy diet and regular exercise. ? ?Exam today as noted. ?Labs pending. ?

## 2021-07-21 NOTE — Assessment & Plan Note (Signed)
Uncontrolled.  ? ?Increase Topamax to 50 mg twice daily. ?Increase sumatriptan to 100 mg as needed. ? ?Referral placed to neurology per patient request. ?

## 2021-08-01 ENCOUNTER — Encounter: Payer: Self-pay | Admitting: *Deleted

## 2021-08-05 ENCOUNTER — Telehealth: Payer: Self-pay

## 2021-08-05 NOTE — Telephone Encounter (Signed)
Type of forms received:FMLA ? ?Routed to:Joellen T ? ?Paperwork received by : Demia Viera S ? ? ?Individual made aware of 3-5 business day turn around (Y/N): Y ? ?Form completed and patient made aware of charges(Y/N): Y ? ? ?Faxed to :  ? ?Form location: Place in mail Box ? ?

## 2021-08-05 NOTE — Telephone Encounter (Signed)
Patient dropped off short term disability form, due by May 11.  Did not see where patient requested. ?I placed form in your box for review.  Thanks,  Jae Dire ? ?

## 2021-08-05 NOTE — Telephone Encounter (Signed)
Please find out some additional information.  She did not mention short-term disability during her recent visit with me. ? ?If requesting a new short-term disability/FMLA claim then needs an office visit.  Okay to be virtual. ?

## 2021-08-08 NOTE — Telephone Encounter (Signed)
Left message to return call to our office.  

## 2021-08-08 NOTE — Telephone Encounter (Signed)
Pt said it was regarding the migraines she sometimes gets and the FMLA would be to cover her so she doesn't get accurances if she is out because of it, she said it would be for intermittent. Frequency 3 to 4 times a month and said that a really bad one can last 1 to 2 days. She said its rare for her to call out but wanted to be covered in the case that she needs to.  ?

## 2021-08-09 NOTE — Telephone Encounter (Signed)
This paperwork does not appear to to be paperwork for intermittent FMLA, however I have completed what I can.  Forms placed in April Bowen's inbox for faxing. ?

## 2021-08-10 NOTE — Telephone Encounter (Signed)
Pt requested a call back at [phone] ?

## 2021-08-11 NOTE — Telephone Encounter (Signed)
Called patient she was just following up to make sure we have received message and that ppw was completed. Let her know all have been faxed. No further action needed.  ?

## 2021-08-12 ENCOUNTER — Other Ambulatory Visit: Payer: Self-pay | Admitting: Primary Care

## 2021-08-12 ENCOUNTER — Encounter: Payer: Self-pay | Admitting: Cardiology

## 2021-08-12 ENCOUNTER — Ambulatory Visit (INDEPENDENT_AMBULATORY_CARE_PROVIDER_SITE_OTHER): Payer: BC Managed Care – PPO

## 2021-08-12 ENCOUNTER — Ambulatory Visit (INDEPENDENT_AMBULATORY_CARE_PROVIDER_SITE_OTHER): Payer: BC Managed Care – PPO | Admitting: Cardiology

## 2021-08-12 VITALS — BP 120/80 | HR 95 | Ht 62.0 in | Wt 142.0 lb

## 2021-08-12 DIAGNOSIS — R002 Palpitations: Secondary | ICD-10-CM | POA: Diagnosis not present

## 2021-08-12 DIAGNOSIS — I498 Other specified cardiac arrhythmias: Secondary | ICD-10-CM

## 2021-08-12 DIAGNOSIS — I10 Essential (primary) hypertension: Secondary | ICD-10-CM | POA: Diagnosis not present

## 2021-08-12 DIAGNOSIS — G43709 Chronic migraine without aura, not intractable, without status migrainosus: Secondary | ICD-10-CM

## 2021-08-12 DIAGNOSIS — I517 Cardiomegaly: Secondary | ICD-10-CM | POA: Diagnosis not present

## 2021-08-12 NOTE — Progress Notes (Signed)
?Cardiology Office Note:   ? ?Date:  08/12/2021  ? ?ID:  April Bowen, DOB [birth year], MRN [id number] ? ?PCP:  Doreene Nest, NP ?  ?[facility] Providers ?Cardiologist:  None    ? ?Referring MD: Doreene Nest, NP  ? ?Chief Complaint  ?Patient presents with  ? New Patient (Initial Visit)  ?  Referred by PCP for Fluttering Sensation. Meds reviewed verbally with patient.   ? ?April Bowen is a 36 y.o. female who is being seen today for the evaluation of heart fluttering at the request of Doreene Nest, NP. ? ? ?History of Present Illness:   ? ?April Bowen is a 36 y.o. female with a hx of hypertension, migraines who presents due to heart fluttering.  ? ? Patient has noticed increased heart rate/fluttering over the past 6 months.  Symptoms occur every 1 to 2 weeks or so.  Denies dizziness or shortness of breath with symptoms. ? ?She has a history of enlarged heart noted on chest x-ray in 2018 after having a complicated delivery.  Otherwise denies any cardiac abnormalities or history of heart disease. ? ?Past Medical History:  ?Diagnosis Date  ? Depression   ? Ectopic pregnancy   ? Ectopic pregnancy, tubal 10/31/2010  ? Epistaxis 05/20/2020  ? Hypertension   ? Migraines   ? Ovarian cyst   ? PCOS (polycystic ovarian syndrome)   ? ? ?Past Surgical History:  ?Procedure Laterality Date  ? ABDOMINAL HYSTERECTOMY  2018  ? one ovary remaining  ? WISDOM TOOTH EXTRACTION    ? ? ?Current Medications: ?Current Meds  ?Medication Sig  ? amLODipine (NORVASC) 5 MG tablet Take 1 tablet (5 mg total) by mouth daily. For blood pressure.  ? hydrochlorothiazide (HYDRODIURIL) 25 MG tablet Take 1 tablet (25 mg total) by mouth daily. for blood pressure. Due in late February for office visit.  ? sertraline (ZOLOFT) 100 MG tablet TAKE 1 TABLET DAILY FOR ANXIETY AND DEPRESSION  ? SUMAtriptan (IMITREX) 100 MG tablet Take 1 tablet (100 mg total) by mouth every 2 (two) hours as needed for migraine. Take 1 tablet by  mouth at migraine onset, may repeat in 2 hours if headache persists or recurs.  Do not exceed 2 tablets in 24 hours.  ? topiramate (TOPAMAX) 50 MG tablet Take 1 tablet (50 mg total) by mouth 2 (two) times daily. For headache prevention.  ?  ? ?Allergies:   Patient has no known allergies.  ? ?Social History  ? ?Socioeconomic History  ? Marital status: Single  ?  Spouse name: Not on file  ? Number of children: Not on file  ? Years of education: Not on file  ? Highest education level: Not on file  ?Occupational History  ? Not on file  ?Tobacco Use  ? Smoking status: Never  ? Smokeless tobacco: Never  ?Vaping Use  ? Vaping Use: Never used  ?Substance and Sexual Activity  ? Alcohol use: No  ? Drug use: No  ? Sexual activity: Yes  ?Other Topics Concern  ? Not on file  ?Social History Narrative  ? Moved from [place], from [place].  ? Works with [employer].  ? Single.  ? 1 child.   ? ?Social Determinants of Health  ? ?Financial Resource Strain: Not on file  ?Food Insecurity: Not on file  ?Transportation Needs: Not on file  ?Physical Activity: Not on file  ?Stress: Not on file  ?Social Connections: Not on file  ?  ? ?  Family History: ?The patient's family history includes Arthritis in her father, maternal grandmother, and mother; Bone cancer in her paternal grandmother; Hypercholesterolemia in her father and mother; Hypertension in her father and mother. ? ?ROS:   ?Please see the history of present illness.    ? All other systems reviewed and are negative. ? ?EKGs/Labs/Other Studies Reviewed:   ? ?The following studies were reviewed today: ? ? ?EKG:  EKG is  ordered today.  The ekg ordered today demonstrates normal sinus rhythm, normal ECG ? ?Recent Labs: ?07/21/2021: ALT 8; BUN 10; Creatinine, Ser 0.78; Hemoglobin 13.3; Platelets 186.0; Potassium 3.8; Sodium 140; TSH 1.42  ?Recent Lipid Panel ?   ?Component Value Date/Time  ? CHOL 144 07/21/2021 1046  ? TRIG 50.0 07/21/2021 1046  ? HDL 59.60 07/21/2021 1046  ? CHOLHDL 2  07/21/2021 1046  ? VLDL 10.0 07/21/2021 1046  ? LDLCALC 75 07/21/2021 1046  ? ? ? ?Risk Assessment/Calculations:   ? ? ?    ? ?Physical Exam:   ? ?VS:  BP 120/80 (BP Location: Right Arm, Patient Position: Sitting, Cuff Size: Normal)   Pulse 95   Ht 5\' 2"  (1.575 m)   Wt 142 lb (64.4 kg)   LMP 12/18/2013   SpO2 98%   BMI 25.97 kg/m?    ? ?Wt Readings from Last 3 Encounters:  ?08/12/21 142 lb (64.4 kg)  ?07/21/21 143 lb (64.9 kg)  ?10/05/20 148 lb 1.9 oz (67.2 kg)  ?  ? ?GEN:  Well nourished, well developed in no acute distress ?HEENT: Normal ?NECK: No JVD; No carotid bruits ?CARDIAC: RRR, no murmurs, rubs, gallops ?RESPIRATORY:  Clear to auscultation without rales, wheezing or rhonchi  ?ABDOMEN: Soft, non-tender, non-distended ?MUSCULOSKELETAL:  No edema; No deformity  ?SKIN: Warm and dry ?NEUROLOGIC:  Alert and oriented x 3 ?PSYCHIATRIC:  Normal affect  ? ?ASSESSMENT:   ? ?1. Fluttering heart   ?2. Cardiomegaly   ?3. Primary hypertension   ?4. Palpitations   ? ?PLAN:   ? ?In order of problems listed above: ? ?Palpitations/heart fluttering.  Place cardiac monitor x 2 weeks to evaluate any significant arrhythmias. ?Cardiomegaly noted on x-ray, likely erroneous secondary to portable x-ray/laying in bed.  Obtain echo to evaluate cardiac size and function. ?Hypertension, BP controlled.  Continue Norvasc, HCTZ. ? ? ?Follow-up after echo and cardiac monitor. ? ?   ? ? ?Medication Adjustments/Labs and Tests Ordered: ?Current medicines are reviewed at length with the patient today.  Concerns regarding medicines are outlined above.  ?Orders Placed This Encounter  ?Procedures  ? LONG TERM MONITOR (3-14 DAYS)  ? EKG 12-Lead  ? ECHOCARDIOGRAM COMPLETE  ? ?No orders of the defined types were placed in this encounter. ? ? ?Patient Instructions  ?Medication Instructions:  ? ?Your physician recommends that you continue on your current medications as directed. Please refer to the Current Medication list given to you  today. ? ?*If you need a refill on your cardiac medications before your next appointment, please call your pharmacy* ? ? ?Lab Work: ?None ordered ? ?If you have labs (blood work) drawn today and your tests are completely normal, you will receive your results only by: ?MyChart Message (if you have MyChart) OR ?A paper copy in the mail ?If you have any lab test that is abnormal or we need to change your treatment, we will call you to review the results. ? ? ?Testing/Procedures: ? ? Your physician has requested that you have an echocardiogram. Echocardiography is a painless test  that uses sound waves to create images of your heart. It provides your doctor with information about the size and shape of your heart and how well your heart?s chambers and valves are working. This procedure takes approximately one hour. There are no restrictions for this procedure. ? ?2.   Your physician has recommended that you wear a Zio XT monitor for 2 weeks. This will be mailed to your home address in 4-5 business days.  ? ?This monitor is a medical device that records the heart?s electrical activity. Doctors most often use these monitors to diagnose arrhythmias. Arrhythmias are problems with the speed or rhythm of the heartbeat. The monitor is a small device applied to your chest. You can wear one while you do your normal daily activities. While wearing this monitor if you have any symptoms to push the button and record what you felt. Once you have worn this monitor for the period of time provider prescribed (Usually 14 days), you will return the monitor device in the postage paid box. Once it is returned they will download the data collected and provide us with a report which the provider will then review and we will call you with those results. Important tips: ? ?Avoid showering during the first 24 hours of wearing the monitor. ?Avoid excessive sweating to help maximize wear time. ?Do not submerge the device, no hot tubs, and no  swimming pools. ?Keep any lotions or oils away from the patch. ?After 24 hours you may shower with the patch on. Take brief showers with your back facing the shower head.  ?Do not remove patch once it has been placed because that

## 2021-08-12 NOTE — Patient Instructions (Signed)
Medication Instructions:  ?Your physician recommends that you continue on your current medications as directed. Please refer to the Current Medication list given to you today. ? ?*If you need a refill on your cardiac medications before your next appointment, please call your pharmacy* ? ? ?Lab Work: ?None ordered ?If you have labs (blood work) drawn today and your tests are completely normal, you will receive your results only by: ?MyChart Message (if you have MyChart) OR ?A paper copy in the mail ?If you have any lab test that is abnormal or we need to change your treatment, we will call you to review the results. ? ? ?Testing/Procedures: ? ?Your physician has requested that you have an echocardiogram. Echocardiography is a painless test that uses sound waves to create images of your heart. It provides your doctor with information about the size and shape of your heart and how well your heart?s chambers and valves are working. This procedure takes approximately one hour. There are no restrictions for this procedure. ? ?2.   Your physician has recommended that you wear a Zio XT monitor for 2 weeks. This will be mailed to your home address in 4-5 business days.  ? ?This monitor is a medical device that records the heart?s electrical activity. Doctors most often use these monitors to diagnose arrhythmias. Arrhythmias are problems with the speed or rhythm of the heartbeat. The monitor is a small device applied to your chest. You can wear one while you do your normal daily activities. While wearing this monitor if you have any symptoms to push the button and record what you felt. Once you have worn this monitor for the period of time provider prescribed (Usually 14 days), you will return the monitor device in the postage paid box. Once it is returned they will download the data collected and provide us with a report which the provider will then review and we will call you with those results. Important tips: ? ?Avoid  showering during the first 24 hours of wearing the monitor. ?Avoid excessive sweating to help maximize wear time. ?Do not submerge the device, no hot tubs, and no swimming pools. ?Keep any lotions or oils away from the patch. ?After 24 hours you may shower with the patch on. Take brief showers with your back facing the shower head.  ?Do not remove patch once it has been placed because that will interrupt data and decrease adhesive wear time. ?Push the button when you have any symptoms and write down what you were feeling. ?Once you have completed wearing your monitor, remove and place into box which has postage paid and place in your outgoing mailbox.  ?If for some reason you have misplaced your box then call our office and we can provide another box and/or mail it off for you. ? ?  ? ? ? ?Follow-Up: ?At [facility], you and your health needs are our priority.  As part of our continuing mission to provide you with exceptional heart care, we have created designated Provider Care Teams.  These Care Teams include your primary Cardiologist (physician) and Advanced Practice Providers (APPs -  Physician Assistants and Nurse Practitioners) who all work together to provide you with the care you need, when you need it. ? ?We recommend signing up for the patient portal called "MyChart".  Sign up information is provided on this After Visit Summary.  MyChart is used to connect with patients for Virtual Visits (Telemedicine).  Patients are able to view lab/test results, encounter notes,   upcoming appointments, etc.  Non-urgent messages can be sent to your provider as well.   ?To learn more about what you can do with MyChart, go to [email or web address].   ? ?Your next appointment:   ?6 week(s) ? ?The format for your next appointment:   ?In Person ? ?Provider:   ?You may see Brian Agbor-Etang, MD or one of the following Advanced Practice Providers on your designated Care Team:   ?Christopher Berge, NP ?Ryan Dunn,  PA-C ?Cadence Furth, PA-C  ? ? ?Other Instructions ? ? ?Important Information About Sugar ? ? ? ? ? ? ?

## 2021-08-15 DIAGNOSIS — R002 Palpitations: Secondary | ICD-10-CM | POA: Diagnosis not present

## 2021-08-24 NOTE — Telephone Encounter (Signed)
Put back in your box to change if needed.

## 2021-09-15 ENCOUNTER — Other Ambulatory Visit: Payer: BC Managed Care – PPO

## 2021-09-26 ENCOUNTER — Ambulatory Visit: Payer: BC Managed Care – PPO | Admitting: Cardiology

## 2021-09-26 ENCOUNTER — Encounter: Payer: Self-pay | Admitting: Cardiology

## 2021-10-16 ENCOUNTER — Other Ambulatory Visit: Payer: Self-pay | Admitting: Primary Care

## 2021-10-16 DIAGNOSIS — G43709 Chronic migraine without aura, not intractable, without status migrainosus: Secondary | ICD-10-CM

## 2021-10-17 ENCOUNTER — Other Ambulatory Visit: Payer: Self-pay | Admitting: Primary Care

## 2021-10-17 DIAGNOSIS — F4323 Adjustment disorder with mixed anxiety and depressed mood: Secondary | ICD-10-CM

## 2021-10-27 ENCOUNTER — Other Ambulatory Visit: Payer: Self-pay

## 2021-10-27 DIAGNOSIS — I10 Essential (primary) hypertension: Secondary | ICD-10-CM

## 2021-10-27 MED ORDER — AMLODIPINE BESYLATE 5 MG PO TABS: 5.0000 mg | ORAL_TABLET | Freq: Every day | ORAL | 0 refills | Status: DC

## 2021-10-27 NOTE — Telephone Encounter (Signed)
Fax received for [facility]ill from [facility]

## 2021-10-27 NOTE — Telephone Encounter (Signed)
Refills sent to pharmacy. 

## 2021-10-27 NOTE — Telephone Encounter (Signed)
Patient called and said she is running low, she asked if [facility] could [facility] to [facility]. [facility] [place]

## 2021-11-01 ENCOUNTER — Telehealth: Payer: Self-pay | Admitting: Primary Care

## 2021-11-01 DIAGNOSIS — I10 Essential (primary) hypertension: Secondary | ICD-10-CM

## 2021-11-01 MED ORDER — AMLODIPINE BESYLATE 5 MG PO TABS: 5.0000 mg | ORAL_TABLET | Freq: Every day | ORAL | 1 refills | Status: DC

## 2021-11-01 NOTE — Telephone Encounter (Signed)
Refills sent to pharmacy. 

## 2021-11-01 NOTE — Telephone Encounter (Signed)
  Encourage patient to contact the pharmacy for refills or they can request refills through [facility]  Did the patient contact the pharmacy:     LAST APPOINTMENT DATE: 07/21/21  NEXT APPOINTMENT DATE:  MEDICATION:amLODipine (NORVASC) 5 MG tablet  Is the patient out of medication? N  If not, how much is left?  Is this a 90 day supply: y 3 refills  PHARMACY: [facility] - [place], [place] - [address] Phone:  [phone]  Fax:  [phone]      Let patient know to contact pharmacy at the end of the day to make sure medication is ready.  Please notify patient to allow 48-72 hours to process

## 2021-12-29 ENCOUNTER — Other Ambulatory Visit: Payer: Self-pay | Admitting: Primary Care

## 2021-12-29 DIAGNOSIS — I10 Essential (primary) hypertension: Secondary | ICD-10-CM

## 2022-02-20 ENCOUNTER — Encounter: Payer: Self-pay | Admitting: Cardiology

## 2022-02-20 NOTE — Progress Notes (Signed)
Unable to contact patient to schedule echocardiogram, letter sent, order cancelled. 

## 2022-03-24 ENCOUNTER — Ambulatory Visit: Payer: BC Managed Care – PPO

## 2022-03-25 ENCOUNTER — Ambulatory Visit
Admission: EM | Admit: 2022-03-25 | Discharge: 2022-03-25 | Disposition: A | Discharge status: Home / Self Care | Payer: BC Managed Care – PPO | Attending: Internal Medicine | Admitting: Internal Medicine

## 2022-03-25 DIAGNOSIS — J069 Acute upper respiratory infection, unspecified: Secondary | ICD-10-CM | POA: Diagnosis not present

## 2022-03-25 DIAGNOSIS — J029 Acute pharyngitis, unspecified: Secondary | ICD-10-CM | POA: Diagnosis not present

## 2022-03-25 LAB — GROUP A STREP BY PCR: Group A Strep by PCR: NOT DETECTED

## 2022-03-25 MED ORDER — BENZONATATE 200 MG PO CAPS: 200.0000 mg | ORAL_CAPSULE | Freq: Three times a day (TID) | ORAL | 0 refills | Status: DC | PRN

## 2022-03-25 MED ORDER — DEXAMETHASONE SODIUM PHOSPHATE 10 MG/ML IJ SOLN
10.0000 mg | Freq: Once | INTRAMUSCULAR | Status: AC
  Administered 2022-03-25: 10 mg via INTRAMUSCULAR

## 2022-03-25 NOTE — Discharge Instructions (Addendum)
You were seen today for upper respiratory symptoms.  You tested negative for strep.  You received a steroid injection for symptom management.  I recommend you try Zyrtec and Flonase OTC.  I sent in cough tablets for you to take every 8 hours as needed.  Rest and fluids will be helpful.  Please follow-up with your PCP if symptoms persist or worsen.

## 2022-03-25 NOTE — ED Triage Notes (Signed)
Pt c/o sore throat x3days  Pt was around her daughter who was positive for the flu on 03/22/22.  Pt is worried about strep throat.  Pt took 800mg  Advil for pain at 10pm on 03/24/22

## 2022-03-25 NOTE — ED Provider Notes (Signed)
[facility]    CSN: [id number] Arrival date & time: 03/25/22  0901      History   Chief Complaint Chief Complaint  Patient presents with   Sore Throat    HPI April Bowen is a 36 y.o. female with history of migraines, HTN, PCOS and depression presents to UC today with complaint of nasal congestion, sore throat and cough.  She reports this started 2 days ago.  She is blowing clear mucus out of her nose.  She is having difficulty swallowing.  The cough is productive of yellow mucus.  She denies headache, runny nose, ear pain, shortness of breath, chest pain, nausea, vomiting or diarrhea.  She denies fever, chills or body aches.  She has tried ibuprofen OTC with minimal relief of symptoms.  She has had sick contacts diagnosed with the flu.  HPI  Past Medical History:  Diagnosis Date   Depression    Ectopic pregnancy    Ectopic pregnancy, tubal 10/31/2010   Epistaxis 05/20/2020   Hypertension    Migraines    Ovarian cyst    PCOS (polycystic ovarian syndrome)     Patient Active Problem List   Diagnosis Date Noted   Fluttering sensation of heart 07/21/2021   Encounter for annual general medical examination with abnormal findings in adult 05/19/2019   Adjustment disorder with mixed anxiety and depressed mood 06/14/2018   Essential hypertension 05/20/2018   Migraines 05/20/2018    Past Surgical History:  Procedure Laterality Date   ABDOMINAL HYSTERECTOMY  2018   one ovary remaining   WISDOM TOOTH EXTRACTION      OB History     Gravida  1   Para      Term      Preterm      AB  1   Living         SAB      IAB      Ectopic  1   Multiple      Live Births               Home Medications    Prior to Admission medications   Medication Sig Start Date End Date Taking? Authorizing Provider  amLODipine (NORVASC) 5 MG tablet Take 1 tablet (5 mg total) by mouth daily. For blood pressure. 11/01/21  Yes Pleas Koch, NP   hydrochlorothiazide (HYDRODIURIL) 25 MG tablet Take 1 tablet (25 mg total) by mouth daily. for blood pressure. 12/29/21  Yes Pleas Koch, NP  sertraline (ZOLOFT) 100 MG tablet TAKE 1 TABLET DAILY FOR ANXIETY AND DEPRESSION 10/17/21  Yes Pleas Koch, NP  SUMAtriptan (IMITREX) 100 MG tablet Take 1 tablet (100 mg total) by mouth every 2 (two) hours as needed for migraine. Take 1 tablet by mouth at migraine onset, may repeat in 2 hours if headache persists or recurs.  Do not exceed 2 tablets in 24 hours. 07/21/21  Yes Pleas Koch, NP  topiramate (TOPAMAX) 50 MG tablet TAKE 1 TABLET (50 MG TOTAL) BY MOUTH 2 (TWO) TIMES DAILY. FOR HEADACHE PREVENTION. 10/16/21  Yes Pleas Koch, NP    Family History Family History  Problem Relation Age of Onset   Hypertension Mother    Arthritis Mother    Hypercholesterolemia Mother    Hypertension Father    Arthritis Father    Hypercholesterolemia Father    Arthritis Maternal Grandmother    Bone cancer Paternal Grandmother     Social History Social History  Tobacco Use   Smoking status: Never   Smokeless tobacco: Never  Vaping Use   Vaping Use: Never used  Substance Use Topics   Alcohol use: No   Drug use: No     Allergies   Patient has no known allergies.   Review of Systems Review of Systems  Constitutional:  Negative for chills and fever.  HENT:  Positive for congestion, postnasal drip, sore throat and trouble swallowing. Negative for ear pain, rhinorrhea, sinus pressure and sinus pain.   Eyes:  Negative for pain and redness.  Respiratory:  Positive for cough. Negative for chest tightness and shortness of breath.   Cardiovascular:  Negative for chest pain.  Gastrointestinal:  Negative for diarrhea, nausea and vomiting.  Musculoskeletal:  Negative for myalgias.  Skin:  Negative for rash.  Neurological:  Negative for dizziness, numbness and headaches.     Physical Exam Triage Vital Signs ED Triage Vitals   Enc Vitals Group     BP 03/25/22 1023 (!) 141/91     Pulse Rate 03/25/22 1023 88     Resp 03/25/22 1023 18     Temp 03/25/22 1023 98.6 F (37 C)     Temp Source 03/25/22 1023 Oral     SpO2 03/25/22 1023 98 %     Weight 03/25/22 1022 142 lb (64.4 kg)     Height 03/25/22 1022 5\' 2"  (1.575 m)     Head Circumference --      Peak Flow --      Pain Score 03/25/22 1021 8     Pain Loc --      Pain Edu? --      Excl. in [place]? --    No data found.  Updated Vital Signs BP (!) 141/91 (BP Location: Left Arm)   Pulse 88   Temp 98.6 F (37 C) (Oral)   Resp 18   Ht 5\' 2"  (1.575 m)   Wt 142 lb (64.4 kg)   LMP 12/18/2013   SpO2 98%   BMI 25.97 kg/m       Physical Exam Constitutional:      Appearance: She is well-developed. She is not ill-appearing.  HENT:     Head: Normocephalic.     Comments: No sinus pressure noted    Nose: Congestion present. No rhinorrhea.     Mouth/Throat:     Mouth: Mucous membranes are dry.     Pharynx: Uvula midline. Pharyngeal swelling and posterior oropharyngeal erythema present. No oropharyngeal exudate.     Tonsils: No tonsillar exudate or tonsillar abscesses. 0 on the right. 0 on the left.  Eyes:     Conjunctiva/sclera: Conjunctivae normal.     Pupils: Pupils are equal, round, and reactive to light.  Cardiovascular:     Rate and Rhythm: Normal rate and regular rhythm.  Pulmonary:     Effort: Pulmonary effort is normal.     Breath sounds: Normal breath sounds. No wheezing, rhonchi or rales.  Lymphadenopathy:     Cervical: No cervical adenopathy.  Skin:    General: Skin is warm and dry.     Findings: No rash.  Neurological:     Mental Status: She is alert and oriented to person, place, and time.      UC Treatments / Results  Labs  Labs Reviewed  GROUP A STREP BY PCR     Medications Ordered in UC Medications  dexamethasone (DECADRON) injection 10 mg (has no administration in time range)    Initial Impression /  Assessment and Plan /  UC Course  I have reviewed the triage vital signs and the nursing notes.  Pertinent labs & imaging results that were available during my care of the patient were reviewed by me and considered in my medical decision making (see chart for details).     36 year old female with 2-day history of nasal congestion, sore throat and cough.  Rapid strep negative.  Encouraged rest and fluids.  Decadron 10 mg IM for symptom management.  Encouraged Flonase and Zyrtec OTC.  Rx for Tessalon 200 mg every 8 hours as needed for cough.  Encouraged rest and fluids.  Advised her to follow-up if symptoms persist or worsen.  Final Clinical Impressions(s) / UC Diagnoses   Final diagnoses:  Viral pharyngitis  Viral URI with cough     Discharge Instructions      You were seen today for upper respiratory symptoms.  You tested negative for strep.  You received a steroid injection for symptom management.  I recommend you try Zyrtec and Flonase OTC.  I sent in cough tablets for you to take every 8 hours as needed.  Rest and fluids will be helpful.  Please follow-up with your PCP if symptoms persist or worsen.     ED Prescriptions   None    PDMP not reviewed this encounter.   Lorre Munroe, NP 03/25/22 1105

## 2022-04-12 ENCOUNTER — Ambulatory Visit: Payer: 59 | Admitting: Primary Care

## 2022-04-12 VITALS — BP 130/84 | HR 102 | Temp 98.2°F | Ht 62.0 in | Wt 147.0 lb

## 2022-04-12 DIAGNOSIS — G43709 Chronic migraine without aura, not intractable, without status migrainosus: Secondary | ICD-10-CM | POA: Diagnosis not present

## 2022-04-12 NOTE — Patient Instructions (Addendum)
Ok to take Topamax 2 tablets together in the morning. Continue PRN Imitrex and PRN Zofran.  Please send the paperwork for FMLA and accommodation.   Follow up in one month for physical.   It was a pleasure to see you today!

## 2022-04-12 NOTE — Assessment & Plan Note (Addendum)
Notes and imaging reviewed from the Neurology in [place].  Uncontrolled. Triggers at work appear to be large contributor.   Agree to complete work accomodation paperwork and intermittent fmla, she will send via MyChart. Start date for accomodation is 04/17/22.   Discussed with patient to take the 2 tablets of Topamax (100 mg total) in the morning to avoid missing evening dose.   Continue Imitrex 100 mg and Zofran 4 mg as needed.   Follow up in 1 month  I evaluated patient, was consulted regarding treatment, and agree with assessment and plan per Tinnie Gens, RN, DNP student.   April Bossier, NP-C   Follow up in one month.

## 2022-04-12 NOTE — Progress Notes (Signed)
Subjective:    Patient ID: April Bowen, female    DOB: Feb 06, 1986, 37 y.o.   MRN: [id number]  Migraine  Associated symptoms include photophobia.    April Bowen is a very pleasant 37 y.o. female  has a past medical history of Depression, Ectopic pregnancy, Ectopic pregnancy, tubal (10/31/2010), Epistaxis (05/20/2020), Hypertension, Migraines, Ovarian cyst, and PCOS (polycystic ovarian syndrome). who presents today to discuss migraines.   Currently managed on Topamax 50 mg BID for migraine prevention and sumatriptan 100 mg PRN for migraine abortion.   Evaluated by neurology, first and last office visit was in May 2023. Emgality injectable was ordered for monthly dosing and sumatriptan was switched to rizatriptan. She also underwent MRI brain in June 2023 which was negative. Neurology was considering switching Zoloft to Effexor but this was never done. It was also advised that she see her eye doctor.   She is not currently on Emgality as insurance would not approve. The rizatriptan was ineffective for migraine abortion. She is doing well on sumatriptan with Zofran PRN. She did complete an eye exam, no prescription changes.  Migraines are typically located behind her eyes which occur 1-2 times weekly lasting a few days at a time. With migraines she experiences photophobia, phonophobia, and nausea.   She is requesting an accomodation to work from home full time due to her migraines. She is also requesting renewal of her intermittent FMLA. The triggers for her migraines are lighting, smells, and noises. She was on vacation from 03/22/22-04/04/22 and experienced no headache or migraine, returned to work on 04/05/22 and has experienced 4 migraines since.   She works in [place], lives here in [place], when migraines occur this causes blurred vision which makes it more difficult to drive.   Overall she does well on Topamax 50 mg BID but often forget to take the evening dose. Topamax  does not cause her drowsiness.   Review of Systems  Eyes:  Positive for photophobia and visual disturbance.  Neurological:  Positive for headaches.  Psychiatric/Behavioral:  The patient is not nervous/anxious.          Past Medical History:  Diagnosis Date   Depression    Ectopic pregnancy    Ectopic pregnancy, tubal 10/31/2010   Epistaxis 05/20/2020   Hypertension    Migraines    Ovarian cyst    PCOS (polycystic ovarian syndrome)     Social History   Socioeconomic History   Marital status: Single    Spouse name: Not on file   Number of children: Not on file   Years of education: Not on file   Highest education level: Not on file  Occupational History   Not on file  Tobacco Use   Smoking status: Never   Smokeless tobacco: Never  Vaping Use   Vaping Use: Never used  Substance and Sexual Activity   Alcohol use: No   Drug use: No   Sexual activity: Yes  Other Topics Concern   Not on file  Social History Narrative   Moved from [place], from [place].   Works with [employer].   Single.   1 child.    Social Determinants of Health   Financial Resource Strain: Not on file  Food Insecurity: Not on file  Transportation Needs: Not on file  Physical Activity: Not on file  Stress: Not on file  Social Connections: Not on file  Intimate Partner Violence: Not on file    Past Surgical History:  Procedure Laterality Date  ABDOMINAL HYSTERECTOMY  2018   one ovary remaining   WISDOM TOOTH EXTRACTION      Family History  Problem Relation Age of Onset   Hypertension Mother    Arthritis Mother    Hypercholesterolemia Mother    Hypertension Father    Arthritis Father    Hypercholesterolemia Father    Arthritis Maternal Grandmother    Bone cancer Paternal Grandmother     No Known Allergies  Current Outpatient Medications on File Prior to Visit  Medication Sig Dispense Refill   amLODipine (NORVASC) 5 MG tablet Take 1 tablet (5 mg total) by mouth daily. For  blood pressure. 90 tablet 1   hydrochlorothiazide (HYDRODIURIL) 25 MG tablet Take 1 tablet (25 mg total) by mouth daily. for blood pressure. 90 tablet 1   ondansetron (ZOFRAN) 4 MG tablet Take 4 mg by mouth every 8 (eight) hours as needed for nausea or vomiting.     sertraline (ZOLOFT) 100 MG tablet TAKE 1 TABLET DAILY FOR ANXIETY AND DEPRESSION 90 tablet 2   SUMAtriptan (IMITREX) 100 MG tablet Take 1 tablet (100 mg total) by mouth every 2 (two) hours as needed for migraine. Take 1 tablet by mouth at migraine onset, may repeat in 2 hours if headache persists or recurs.  Do not exceed 2 tablets in 24 hours. 10 tablet 0   topiramate (TOPAMAX) 50 MG tablet TAKE 1 TABLET (50 MG TOTAL) BY MOUTH 2 (TWO) TIMES DAILY. FOR HEADACHE PREVENTION. 180 tablet 2   benzonatate (TESSALON) 200 MG capsule Take 1 capsule (200 mg total) by mouth 3 (three) times daily as needed for cough. (Patient not taking: Reported on 04/12/2022) 20 capsule 0   No current facility-administered medications on file prior to visit.    BP 130/84   Pulse (!) 102   Temp 98.2 F (36.8 C) (Temporal)   Ht 5\' 2"  (1.575 m)   Wt 147 lb (66.7 kg)   LMP 12/18/2013   SpO2 99%   BMI 26.89 kg/m  Objective:   Physical Exam Eyes:     Extraocular Movements: Extraocular movements intact.     Pupils: Pupils are equal, round, and reactive to light.  Cardiovascular:     Rate and Rhythm: Normal rate and regular rhythm.  Pulmonary:     Effort: Pulmonary effort is normal.     Breath sounds: Normal breath sounds.  Musculoskeletal:     Cervical back: Neck supple.  Skin:    General: Skin is warm and dry.  Neurological:     Mental Status: She is oriented to person, place, and time.     Cranial Nerves: No cranial nerve deficit.  Psychiatric:        Mood and Affect: Mood normal.           Assessment & Plan:  Chronic migraine without aura without status migrainosus, not intractable Assessment & Plan: Notes and imaging reviewed from the  Neurology in [place].  Uncontrolled. Triggers at work appear to be large contributor.   Agree to complete work accomodation paperwork and intermittent fmla, she will send via MyChart. Start date for accomodation is 04/17/22.   Discussed with patient to take the 2 tablets of Topamax (100 mg total) in the morning to avoid missing evening dose.   Continue Imitrex 100 mg and Zofran 4 mg as needed.   Follow up in 1 month  I evaluated patient, was consulted regarding treatment, and agree with assessment and plan per Tinnie Gens, RN, DNP student.  [name], NP-C   Follow up in one month.          Doreene Nest, NP

## 2022-04-12 NOTE — Progress Notes (Signed)
Established Patient Office Visit  Subjective   Patient ID: April Bowen, female    DOB: Dec 19, 1985  Age: 37 y.o. MRN: [id number]  Chief Complaint  Patient presents with   Migraine    Having a migraine 2-3 times a week.  She works in office 3 days a week and thinks that is triggering it [employer], screens all day, noise, smells) She is requesting documentation for accomodation for work. She has paperwork she will upload to mychart    Migraine  Associated symptoms include blurred vision, dizziness and weakness.    April Bowen is a 37 year old female with past medical history of migraines, hypertension, adjustment disorder with anxiety and depression who presents today to discuss migraines.   She has a history of migraines and is currently managed on Sumatriptan 100 mg as needed and Topamax 50 mg BID.   She saw the neurologist at [facility] on 08/17/21. She was started on Emgality monthly injectable for migraines, continue Topamax 100 mg, stopping Imitrex and starting Maxalt 10 mg. Brain MRI was also ordered at that time. She had the MRI done on 09/22/21 which was normal. The neurologist also recommended for the patient to get an eye exam and consider adding nortriptyline or switching Zoloft to Effexor for headaches.  Today, she states that the Emgality was not covered with insurance. She tried the Maxalt and did not help give her relief. She is back to Imitrex 100 mg with Zofran as needed for nausea. She is tolerating the Topamax and Imitrex well. Occasional nausea for which she takes PRN Zofran which helps subsides it. Her migraine usually starts behind her eyes and gives her blurred vision. She usually has to lay down in a dark and close her eyes. She experiences 1-2 migraines a week and it usually lasts for a couple of days.  She did have her eye exam and was told to get some reading glasses.   Triggers for her migraines are noise at work, smell and photosensitivity trigger  her migraines at work. She would like FMLA to be able to work from home full-time. She says that her work does provides that accomodation.   She was on vacation from 03/22/22-04/04/22 and had no headaches/migraines. She went back to work on 04/05/22 and had three migraines since went back to work.     Patient Active Problem List   Diagnosis Date Noted   Fluttering sensation of heart 07/21/2021   Encounter for annual general medical examination with abnormal findings in adult 05/19/2019   Adjustment disorder with mixed anxiety and depressed mood 06/14/2018   Essential hypertension 05/20/2018   Migraines 05/20/2018   Past Medical History:  Diagnosis Date   Depression    Ectopic pregnancy    Ectopic pregnancy, tubal 10/31/2010   Epistaxis 05/20/2020   Hypertension    Migraines    Ovarian cyst    PCOS (polycystic ovarian syndrome)    Past Surgical History:  Procedure Laterality Date   ABDOMINAL HYSTERECTOMY  2018   one ovary remaining   WISDOM TOOTH EXTRACTION     Social History   Tobacco Use   Smoking status: Never   Smokeless tobacco: Never  Vaping Use   Vaping Use: Never used  Substance Use Topics   Alcohol use: No   Drug use: No   Family Status  Relation Name Status   Mother  Alive   Father  Alive   MGM  Deceased   MGF  Deceased  PGM  Deceased   PGF  Deceased   Family History  Problem Relation Age of Onset   Hypertension Mother    Arthritis Mother    Hypercholesterolemia Mother    Hypertension Father    Arthritis Father    Hypercholesterolemia Father    Arthritis Maternal Grandmother    Bone cancer Paternal Grandmother    No Known Allergies    Review of Systems  Constitutional:  Negative for malaise/fatigue.  Eyes:  Positive for blurred vision.  Respiratory:  Negative for shortness of breath.   Cardiovascular:  Negative for chest pain.  Neurological:  Positive for dizziness, weakness and headaches.  Psychiatric/Behavioral:  Negative for depression.  The patient is not nervous/anxious.       Objective:     BP 130/84   Pulse (!) 102   Temp 98.2 F (36.8 C) (Temporal)   Ht 5\' 2"  (1.575 m)   Wt 147 lb (66.7 kg)   LMP 12/18/2013   SpO2 99%   BMI 26.89 kg/m  BP Readings from Last 3 Encounters:  04/12/22 130/84  03/25/22 (!) 141/91  08/12/21 120/80   Wt Readings from Last 3 Encounters:  04/12/22 147 lb (66.7 kg)  03/25/22 142 lb (64.4 kg)  08/12/21 142 lb (64.4 kg)      Physical Exam Vitals and nursing note reviewed.  Eyes:     Extraocular Movements: Extraocular movements intact.     Pupils: Pupils are equal, round, and reactive to light.  Cardiovascular:     Rate and Rhythm: Normal rate and regular rhythm.     Pulses: Normal pulses.     Heart sounds: Normal heart sounds.  Pulmonary:     Effort: Pulmonary effort is normal.     Breath sounds: Normal breath sounds.  Neurological:     Mental Status: She is oriented to person, place, and time.  Psychiatric:        Mood and Affect: Mood normal.        Behavior: Behavior normal.      No results found for any visits on 04/12/22.     The ASCVD Risk score (Arnett DK, et al., 2019) failed to calculate for the following reasons:   The 2019 ASCVD risk score is only valid for ages 39 to 14    Assessment & Plan:   Problem List Items Addressed This Visit       Cardiovascular and Mediastinum   Migraines - Primary    Notes and imaging reviewed from the Neurologist.   Uncontrolled. Contributes to triggers at work.   She will have the work accomodation paperwork and fmla sent to [place] via [employer]. She would like to be able to start working from home starting Monday 04/17/22 and try this for one year to see if her migraines decrease. FMLA is for her to be excused from work 1-2 days a month due to migraines.   Discussed with patient to take the 2 tablets (100 mg total) in the morning to avoid missing evening dose.   Continue Imitrex 100 mg and Zofran 4 mg as needed.    Follow up in one month.        No follow-ups on file.    Tinnie Gens, BSN-RN, DNP STUDENT

## 2022-04-13 NOTE — Telephone Encounter (Signed)
FMLA placed on April Bowen's desk. Can you provide her with a copy? See Mychart message.

## 2022-04-14 ENCOUNTER — Telehealth: Payer: Self-pay

## 2022-04-14 NOTE — Telephone Encounter (Signed)
I called patient about her completed FMLA form.  She will be coming in to sign this on Monday between 8-9.  I have the form at my desk.  While she is here signing form, she will have me fax it so she can have a complete copy before she leaves.

## 2022-04-17 NOTE — Telephone Encounter (Signed)
Completed form faxed to [phone].  Received fax confirmation.  Copies made for scanning, patient, and myself.  Patient's copy has been placed in outgoing mail, per request.

## 2022-04-21 ENCOUNTER — Encounter: Payer: BC Managed Care – PPO | Admitting: Primary Care

## 2022-05-05 NOTE — Telephone Encounter (Signed)
Patient would like a copy of the accomodations paperwork that was sent over to April Bowen at her job. She would like this information printed off so that she can come by and pick it up.

## 2022-05-17 ENCOUNTER — Telehealth: Payer: Self-pay | Admitting: Primary Care

## 2022-05-17 NOTE — Telephone Encounter (Signed)
Thayer Headings from [employer] is needing clarification for the reason that the employee needs to only work form home,instead of the 1 day a week that she does go in to work?she is needing to know if this is a preference of the employee or if this is medically necessary?

## 2022-05-17 NOTE — Telephone Encounter (Signed)
Medically necessary.

## 2022-05-17 NOTE — Telephone Encounter (Signed)
Called and notified Janince of this information, she noted it in the patients profile

## 2022-06-07 ENCOUNTER — Other Ambulatory Visit: Payer: Self-pay | Admitting: Primary Care

## 2022-06-07 DIAGNOSIS — G43709 Chronic migraine without aura, not intractable, without status migrainosus: Secondary | ICD-10-CM

## 2022-06-09 ENCOUNTER — Other Ambulatory Visit: Payer: Self-pay | Admitting: Primary Care

## 2022-06-09 DIAGNOSIS — I10 Essential (primary) hypertension: Secondary | ICD-10-CM

## 2022-06-10 NOTE — Telephone Encounter (Signed)
Patient is due for CPE/follow up in late April/early May, this will be required prior to any further refills.  Please schedule, thank you!

## 2022-06-12 NOTE — Telephone Encounter (Signed)
Spoke to pt, pt stated she recently switched insurances & can have her cpe before 4/20. Scheduled cpe for 07/12/22

## 2022-07-12 ENCOUNTER — Ambulatory Visit (INDEPENDENT_AMBULATORY_CARE_PROVIDER_SITE_OTHER): Payer: 59 | Admitting: Primary Care

## 2022-07-12 ENCOUNTER — Encounter: Payer: Self-pay | Admitting: Primary Care

## 2022-07-12 VITALS — BP 126/84 | HR 97 | Temp 98.1°F | Ht 62.0 in | Wt 142.0 lb

## 2022-07-12 DIAGNOSIS — I10 Essential (primary) hypertension: Secondary | ICD-10-CM | POA: Diagnosis not present

## 2022-07-12 DIAGNOSIS — Z0000 Encounter for general adult medical examination without abnormal findings: Secondary | ICD-10-CM | POA: Diagnosis not present

## 2022-07-12 DIAGNOSIS — J3489 Other specified disorders of nose and nasal sinuses: Secondary | ICD-10-CM | POA: Insufficient documentation

## 2022-07-12 DIAGNOSIS — F4323 Adjustment disorder with mixed anxiety and depressed mood: Secondary | ICD-10-CM

## 2022-07-12 DIAGNOSIS — G43009 Migraine without aura, not intractable, without status migrainosus: Secondary | ICD-10-CM | POA: Diagnosis not present

## 2022-07-12 LAB — LIPID PANEL
Cholesterol: 142 mg/dL (ref 0–200)
HDL: 66.2 mg/dL (ref >39.00)
LDL Cholesterol: 62 mg/dL (ref 0–99)
NonHDL: 75.38
Total CHOL/HDL Ratio: 2
Triglycerides: 69 mg/dL (ref 0.0–149.0)
VLDL: 13.8 mg/dL (ref 0.0–40.0)

## 2022-07-12 LAB — COMPREHENSIVE METABOLIC PANEL
ALT: 9 U/L (ref 0–35)
AST: 13 U/L (ref 0–37)
Albumin: 4.3 g/dL (ref 3.5–5.2)
Alkaline Phosphatase: 52 U/L (ref 39–117)
BUN: 9 mg/dL (ref 6–23)
CO2: 28 mEq/L (ref 19–32)
Calcium: 9.4 mg/dL (ref 8.4–10.5)
Chloride: 102 mEq/L (ref 96–112)
Creatinine, Ser: 0.82 mg/dL (ref 0.40–1.20)
GFR: 91.56 mL/min (ref >60.00)
Glucose, Bld: 91 mg/dL (ref 70–99)
Potassium: 3.9 mEq/L (ref 3.5–5.1)
Sodium: 138 mEq/L (ref 135–145)
Total Bilirubin: 0.9 mg/dL (ref 0.2–1.2)
Total Protein: 7.4 g/dL (ref 6.0–8.3)

## 2022-07-12 NOTE — Patient Instructions (Signed)
Stop taking amlodipine 5 mg for blood pressure.  Nasal Congestion/Ear Pressure/Sinus Pressure: Try using Flonase (fluticasone) nasal spray. Instill 1 spray in each nostril twice daily.   Stop by the lab prior to leaving today. I will notify you of your results once received.   It was a pleasure to see you today!

## 2022-07-12 NOTE — Progress Notes (Signed)
Subjective:    Patient ID: April Bowen, female    DOB: [birth year], 37 y.o.   MRN: [id number]  HPI  April Bowen is a very pleasant 37 y.o. female who presents today for complete physical and follow up of chronic conditions.  She would also like to discuss sinus pressure. Symptom onset about 10-12 days ago. She took a 7 day course of Claritin-D, last dose was about 5-6 days ago. Symptoms of sinus pressure and nasal congestion improved so she stopped Claritin-D. About 2-3 days ago her symptoms returned with sinus pressure, yellow discharge, and some bloody nasal mucous. She's taking regular Claritin now.   Immunizations: -Tetanus: Completed in 2018 -Influenza: Completed this season  Diet: Fair diet.  Exercise: No regular exercise.  Eye exam: Completes annually  Dental exam: Completes semi-annually   Pap Smear: Follows with GYN.    BP Readings from Last 3 Encounters:  07/12/22 126/84  04/12/22 130/84  03/25/22 (!) 141/91      Review of Systems  Constitutional:  Negative for fever and unexpected weight change.  HENT:  Positive for congestion, rhinorrhea and sinus pressure.   Respiratory:  Negative for cough and shortness of breath.   Cardiovascular:  Negative for chest pain.  Gastrointestinal:  Negative for constipation and diarrhea.  Genitourinary:  Negative for difficulty urinating and menstrual problem.  Musculoskeletal:  Positive for arthralgias.  Skin:  Negative for rash.  Allergic/Immunologic: Negative for environmental allergies.  Neurological:  Negative for dizziness and headaches.  Psychiatric/Behavioral:  The patient is not nervous/anxious.          Past Medical History:  Diagnosis Date   Depression    Ectopic pregnancy    Ectopic pregnancy, tubal 10/31/2010   Epistaxis 05/20/2020   Hypertension    Migraines    Ovarian cyst    PCOS (polycystic ovarian syndrome)     Social History   Socioeconomic History   Marital status: Single     Spouse name: Not on file   Number of children: Not on file   Years of education: Not on file   Highest education level: Not on file  Occupational History   Not on file  Tobacco Use   Smoking status: Never   Smokeless tobacco: Never  Vaping Use   Vaping Use: Never used  Substance and Sexual Activity   Alcohol use: No   Drug use: No   Sexual activity: Yes  Other Topics Concern   Not on file  Social History Narrative   Moved from [place], from [place].   Works with [employer].   Single.   1 child.    Social Determinants of Health   Financial Resource Strain: Not on file  Food Insecurity: Not on file  Transportation Needs: Not on file  Physical Activity: Not on file  Stress: Not on file  Social Connections: Not on file  Intimate Partner Violence: Not on file    Past Surgical History:  Procedure Laterality Date   ABDOMINAL HYSTERECTOMY  2018   one ovary remaining   WISDOM TOOTH EXTRACTION      Family History  Problem Relation Age of Onset   Hypertension Mother    Arthritis Mother    Hypercholesterolemia Mother    Hypertension Father    Arthritis Father    Hypercholesterolemia Father    Arthritis Maternal Grandmother    Bone cancer Paternal Grandmother     No Known Allergies  Current Outpatient Medications on File Prior to Visit  Medication  Sig Dispense Refill   hydrochlorothiazide (HYDRODIURIL) 25 MG tablet TAKE 1 TABLET DAILY FOR BLOOD PRESSURE 90 tablet 0   ondansetron (ZOFRAN) 4 MG tablet Take 4 mg by mouth every 8 (eight) hours as needed for nausea or vomiting.     sertraline (ZOLOFT) 100 MG tablet TAKE 1 TABLET DAILY FOR ANXIETY AND DEPRESSION 90 tablet 2   SUMAtriptan (IMITREX) 100 MG tablet Take 1 tablet (100 mg total) by mouth every 2 (two) hours as needed for migraine. Take 1 tablet by mouth at migraine onset, may repeat in 2 hours if headache persists or recurs.  Do not exceed 2 tablets in 24 hours. 10 tablet 0   topiramate (TOPAMAX) 50 MG tablet  TAKE 1 TABLET (50 MG TOTAL) BY MOUTH 2 (TWO) TIMES DAILY. FOR HEADACHE PREVENTION. 180 tablet 2   No current facility-administered medications on file prior to visit.    BP 126/84   Pulse 97   Temp 98.1 F (36.7 C) (Temporal)   Ht 5\' 2"  (1.575 m)   Wt 142 lb (64.4 kg)   LMP 12/18/2013   SpO2 99%   BMI 25.97 kg/m  Objective:   Physical Exam HENT:     Right Ear: Tympanic membrane and ear canal normal.     Left Ear: Tympanic membrane and ear canal normal.     Nose: Nose normal.  Eyes:     Conjunctiva/sclera: Conjunctivae normal.     Pupils: Pupils are equal, round, and reactive to light.  Neck:     Thyroid: No thyromegaly.  Cardiovascular:     Rate and Rhythm: Normal rate and regular rhythm.     Heart sounds: No murmur heard. Pulmonary:     Effort: Pulmonary effort is normal.     Breath sounds: Normal breath sounds. No rales.  Abdominal:     General: Bowel sounds are normal.     Palpations: Abdomen is soft.     Tenderness: There is no abdominal tenderness.  Musculoskeletal:        General: Normal range of motion.     Cervical back: Neck supple.  Lymphadenopathy:     Cervical: No cervical adenopathy.  Skin:    General: Skin is warm and dry.     Findings: No rash.  Neurological:     Mental Status: She is alert and oriented to person, place, and time.     Cranial Nerves: No cranial nerve deficit.     Deep Tendon Reflexes: Reflexes are normal and symmetric.  Psychiatric:        Mood and Affect: Mood normal.           Assessment & Plan:  Preventative health care Assessment & Plan: Immunizations UTD. Pap smear UTD. Follows with [facility].  Discussed the importance of a healthy diet and regular exercise in order for weight loss, and to reduce the risk of further co-morbidity.  Exam stable. Labs pending.  Follow up in 1 year for repeat physical.    Essential hypertension Assessment & Plan: Symptoms of dizziness.  Will stop amlodipine 5 mg. Continue  HCTZ 25 mg daily.  CMP pending.  Orders: -     Lipid panel -     Comprehensive metabolic panel  Migraine without aura and without status migrainosus, not intractable Assessment & Plan: Controlled and improved!  Continue work from home accomodation. Continue Topamax 50 mg BID. Continue sumatriptan 100 mg PRN.    Adjustment disorder with mixed anxiety and depressed mood Assessment & Plan: Controlled.  Continue Zoloft 100 mg daily.    Sinus pressure Assessment & Plan: Discussed risks for rebound symptoms from decongestant use. Remain off Claritin-D.  Continue Claritin. Add Flonase BID.  She will update early next week.          Doreene Nest, NP

## 2022-07-12 NOTE — Assessment & Plan Note (Addendum)
Symptoms of dizziness.  Will stop amlodipine 5 mg. Continue HCTZ 25 mg daily.  CMP pending.

## 2022-07-12 NOTE — Assessment & Plan Note (Signed)
Controlled.  Continue Zoloft 100 mg daily 

## 2022-07-12 NOTE — Assessment & Plan Note (Signed)
Immunizations UTD. Pap smear UTD. Follows with [facility].  Discussed the importance of a healthy diet and regular exercise in order for weight loss, and to reduce the risk of further co-morbidity.  Exam stable. Labs pending.  Follow up in 1 year for repeat physical.

## 2022-07-12 NOTE — Assessment & Plan Note (Signed)
Controlled and improved!  Continue work from home accomodation. Continue Topamax 50 mg BID. Continue sumatriptan 100 mg PRN.

## 2022-07-12 NOTE — Assessment & Plan Note (Signed)
Discussed risks for rebound symptoms from decongestant use. Remain off Claritin-D.  Continue Claritin. Add Flonase BID.  She will update early next week.

## 2022-07-13 ENCOUNTER — Other Ambulatory Visit: Payer: Self-pay | Admitting: Primary Care

## 2022-07-13 DIAGNOSIS — F4323 Adjustment disorder with mixed anxiety and depressed mood: Secondary | ICD-10-CM

## 2022-08-21 ENCOUNTER — Telehealth: Payer: Self-pay | Admitting: Primary Care

## 2022-08-21 NOTE — Telephone Encounter (Signed)
PPW placed in Kates inbox for review and completion.

## 2022-08-21 NOTE — Telephone Encounter (Signed)
Type of forms received: FMLA  Routed YQ:[id number] Pool  Paperwork received by : Lesly Rubenstein   Individual made aware of 3-5 business day turn around (Y/N): Y  Form completed and patient made aware of charges(Y/N): Y   Faxed to :   Form location: Place in PCP folder

## 2022-08-22 NOTE — Telephone Encounter (Signed)
Completed paperwork and placed in Kelli's inbox.

## 2022-09-07 ENCOUNTER — Other Ambulatory Visit: Payer: Self-pay | Admitting: Primary Care

## 2022-09-07 DIAGNOSIS — I10 Essential (primary) hypertension: Secondary | ICD-10-CM

## 2023-03-29 ENCOUNTER — Encounter: Payer: Self-pay | Admitting: Internal Medicine

## 2023-03-29 ENCOUNTER — Ambulatory Visit: Payer: 59 | Admitting: Internal Medicine

## 2023-03-29 VITALS — BP 132/80 | HR 90 | Temp 99.2°F | Ht 62.0 in | Wt 150.0 lb

## 2023-03-29 DIAGNOSIS — J0100 Acute maxillary sinusitis, unspecified: Secondary | ICD-10-CM | POA: Insufficient documentation

## 2023-03-29 HISTORY — DX: Acute maxillary sinusitis, unspecified: J01.00

## 2023-03-29 MED ORDER — BENZONATATE 200 MG PO CAPS: 200.0000 mg | ORAL_CAPSULE | Freq: Three times a day (TID) | ORAL | 0 refills | Status: DC | PRN

## 2023-03-29 MED ORDER — DOXYCYCLINE HYCLATE 100 MG PO TABS: 100.0000 mg | ORAL_TABLET | Freq: Two times a day (BID) | ORAL | 1 refills | Status: DC

## 2023-03-29 NOTE — Progress Notes (Signed)
Subjective:    Patient ID: April Bowen, female    DOB: Aug 02, 1985, 37 y.o.   MRN: [id number]  HPI Here due to respiratory symptoms  Started a week ago Cough and sore throat Gargling with salt water some help Spitting up a lot of mucus with cough----using mucinex DM Using nasonex also No fever/myalgias, etc  Then this morning---started with left eye red and watery/green drainage  Frontal headache--tylenol some help No ear pain No SOB  Current Outpatient Medications on File Prior to Visit  Medication Sig Dispense Refill   hydrochlorothiazide (HYDRODIURIL) 25 MG tablet TAKE 1 TABLET DAILY FOR BLOOD PRESSURE 90 tablet 2   ondansetron (ZOFRAN) 4 MG tablet Take 4 mg by mouth every 8 (eight) hours as needed for nausea or vomiting.     sertraline (ZOLOFT) 100 MG tablet TAKE 1 TABLET DAILY FOR ANXIETY AND DEPRESSION 90 tablet 3   SUMAtriptan (IMITREX) 100 MG tablet Take 1 tablet (100 mg total) by mouth every 2 (two) hours as needed for migraine. Take 1 tablet by mouth at migraine onset, may repeat in 2 hours if headache persists or recurs.  Do not exceed 2 tablets in 24 hours. 10 tablet 0   topiramate (TOPAMAX) 50 MG tablet TAKE 1 TABLET (50 MG TOTAL) BY MOUTH 2 (TWO) TIMES DAILY. FOR HEADACHE PREVENTION. 180 tablet 2   No current facility-administered medications on file prior to visit.    No Known Allergies  Past Medical History:  Diagnosis Date   Depression    Ectopic pregnancy    Ectopic pregnancy, tubal 10/31/2010   Epistaxis 05/20/2020   Hypertension    Migraines    Ovarian cyst    PCOS (polycystic ovarian syndrome)     Past Surgical History:  Procedure Laterality Date   ABDOMINAL HYSTERECTOMY  2018   one ovary remaining   WISDOM TOOTH EXTRACTION      Family History  Problem Relation Age of Onset   Hypertension Mother    Arthritis Mother    Hypercholesterolemia Mother    Hypertension Father    Arthritis Father    Hypercholesterolemia Father    Arthritis  Maternal Grandmother    Bone cancer Paternal Grandmother     Social History   Socioeconomic History   Marital status: Single    Spouse name: Not on file   Number of children: Not on file   Years of education: Not on file   Highest education level: Not on file  Occupational History   Not on file  Tobacco Use   Smoking status: Never   Smokeless tobacco: Never  Vaping Use   Vaping status: Never Used  Substance and Sexual Activity   Alcohol use: No   Drug use: No   Sexual activity: Yes  Other Topics Concern   Not on file  Social History Narrative   Moved from [place], from [place].   Works with [employer].   Single.   1 child.    Social Drivers of Corporate investment banker Strain: Not on file  Food Insecurity: Not on file  Transportation Needs: Not on file  Physical Activity: Not on file  Stress: Not on file  Social Connections: Unknown (09/12/2021)   [facility], [facility]   Social Network    Social Network: Not on file  Intimate Partner Violence: Unknown (09/12/2021)   Received from [facility], [facility]   HITS    Physically Hurt: Not on file    Insult or  Talk Down To: Not on file    Threaten Physical Harm: Not on file    Scream or Curse: Not on file   Review of Systems No ill exposures No N/V Eating okay     Objective:   Physical Exam Constitutional:      Appearance: Normal appearance.  HENT:     Head:     Comments: No sinus tenderness    Right Ear: Tympanic membrane and ear canal normal.     Left Ear: Tympanic membrane and ear canal normal.     Mouth/Throat:     Pharynx: No oropharyngeal exudate or posterior oropharyngeal erythema.  Eyes:     Comments: Mild conjunctival injection  Pulmonary:     Effort: Pulmonary effort is normal.     Breath sounds: Normal breath sounds. No wheezing or rales.  Musculoskeletal:     Cervical back: Neck supple.  Lymphadenopathy:     Cervical: No cervical adenopathy.  Neurological:      Mental Status: She is alert.            Assessment & Plan:

## 2023-03-29 NOTE — Assessment & Plan Note (Signed)
Likely the cause of the eye symptoms as well Not clearly atypical infection--but that has been around Discussed analgesics and the nasonex Will treat with doxy 100 bid x 7 days benzonatate

## 2023-04-13 ENCOUNTER — Other Ambulatory Visit: Payer: Self-pay | Admitting: Primary Care

## 2023-04-13 DIAGNOSIS — I10 Essential (primary) hypertension: Secondary | ICD-10-CM

## 2023-05-06 ENCOUNTER — Emergency Department (HOSPITAL_BASED_OUTPATIENT_CLINIC_OR_DEPARTMENT_OTHER)
Admission: EM | Admit: 2023-05-06 | Discharge: 2023-05-06 | Disposition: A | Discharge status: Home / Self Care | Payer: 59 | Attending: Emergency Medicine | Admitting: Emergency Medicine

## 2023-05-06 ENCOUNTER — Emergency Department (HOSPITAL_BASED_OUTPATIENT_CLINIC_OR_DEPARTMENT_OTHER): Payer: 59

## 2023-05-06 ENCOUNTER — Encounter (HOSPITAL_BASED_OUTPATIENT_CLINIC_OR_DEPARTMENT_OTHER): Payer: Self-pay | Admitting: Emergency Medicine

## 2023-05-06 ENCOUNTER — Other Ambulatory Visit: Payer: Self-pay

## 2023-05-06 DIAGNOSIS — N8311 Corpus luteum cyst of right ovary: Secondary | ICD-10-CM | POA: Diagnosis not present

## 2023-05-06 DIAGNOSIS — R1031 Right lower quadrant pain: Secondary | ICD-10-CM

## 2023-05-06 DIAGNOSIS — N83201 Unspecified ovarian cyst, right side: Secondary | ICD-10-CM

## 2023-05-06 LAB — URINALYSIS, ROUTINE W REFLEX MICROSCOPIC
Bacteria, UA: NONE SEEN
Bilirubin Urine: NEGATIVE
Glucose, UA: NEGATIVE mg/dL
Ketones, ur: NEGATIVE mg/dL
Leukocytes,Ua: NEGATIVE
Nitrite: NEGATIVE
Protein, ur: NEGATIVE mg/dL
Specific Gravity, Urine: 1.012 (ref 1.005–1.030)
pH: 6.5 (ref 5.0–8.0)

## 2023-05-06 LAB — COMPREHENSIVE METABOLIC PANEL
ALT: 8 U/L (ref 0–44)
AST: 12 U/L — ABNORMAL LOW (ref 15–41)
Albumin: 3.8 g/dL (ref 3.5–5.0)
Alkaline Phosphatase: 50 U/L (ref 38–126)
Anion gap: 7 (ref 5–15)
BUN: 8 mg/dL (ref 6–20)
CO2: 25 mmol/L (ref 22–32)
Calcium: 8.6 mg/dL — ABNORMAL LOW (ref 8.9–10.3)
Chloride: 104 mmol/L (ref 98–111)
Creatinine, Ser: 0.65 mg/dL (ref 0.44–1.00)
GFR, Estimated: >60 mL/min (ref >60)
Glucose, Bld: 89 mg/dL (ref 70–99)
Potassium: 3.9 mmol/L (ref 3.5–5.1)
Sodium: 136 mmol/L (ref 135–145)
Total Bilirubin: 0.4 mg/dL (ref 0.0–1.2)
Total Protein: 6.7 g/dL (ref 6.5–8.1)

## 2023-05-06 LAB — CBC WITH DIFFERENTIAL/PLATELET
Abs Immature Granulocytes: 0.02 10*3/uL (ref 0.00–0.07)
Basophils Absolute: 0 10*3/uL (ref 0.0–0.1)
Basophils Relative: 0 %
Eosinophils Absolute: 0.1 10*3/uL (ref 0.0–0.5)
Eosinophils Relative: 1 %
HCT: 38.4 % (ref 36.0–46.0)
Hemoglobin: 12.8 g/dL (ref 12.0–15.0)
Immature Granulocytes: 0 %
Lymphocytes Relative: 18 %
Lymphs Abs: 1.9 10*3/uL (ref 0.7–4.0)
MCH: 31.4 pg (ref 26.0–34.0)
MCHC: 33.3 g/dL (ref 30.0–36.0)
MCV: 94.1 fL (ref 80.0–100.0)
Monocytes Absolute: 0.5 10*3/uL (ref 0.1–1.0)
Monocytes Relative: 5 %
Neutro Abs: 8.1 10*3/uL — ABNORMAL HIGH (ref 1.7–7.7)
Neutrophils Relative %: 76 %
Platelets: 160 10*3/uL (ref 150–400)
RBC: 4.08 MIL/uL (ref 3.87–5.11)
RDW: 12.7 % (ref 11.5–15.5)
WBC: 10.6 10*3/uL — ABNORMAL HIGH (ref 4.0–10.5)
nRBC: 0 % (ref 0.0–0.2)

## 2023-05-06 LAB — HCG, SERUM, QUALITATIVE: Preg, Serum: NEGATIVE

## 2023-05-06 LAB — LIPASE, BLOOD: Lipase: 15 U/L (ref 11–51)

## 2023-05-06 MED ORDER — KETOROLAC TROMETHAMINE 15 MG/ML IJ SOLN
15.0000 mg | Freq: Once | INTRAMUSCULAR | Status: AC
  Administered 2023-05-06: 15 mg via INTRAVENOUS
  Filled 2023-05-06: qty 1

## 2023-05-06 MED ORDER — IOHEXOL 300 MG/ML  SOLN
100.0000 mL | Freq: Once | INTRAMUSCULAR | Status: AC | PRN
  Administered 2023-05-06: 80 mL via INTRAVENOUS

## 2023-05-06 MED ORDER — NAPROXEN 500 MG PO TABS: 500.0000 mg | ORAL_TABLET | Freq: Two times a day (BID) | ORAL | 0 refills | Status: DC

## 2023-05-06 MED ORDER — OXYCODONE HCL 5 MG PO TABS: 5.0000 mg | ORAL_TABLET | Freq: Four times a day (QID) | ORAL | 0 refills | Status: DC | PRN

## 2023-05-06 NOTE — Discharge Instructions (Signed)
Please read and follow all provided instructions.  Your diagnoses today include:  1. Cyst of right ovary   2. RLQ abdominal pain     Tests performed today include: Blood cell counts and platelets: high white blood cells Kidney and liver function tests Pancreas function test (called lipase) Urine test to look for infection A blood or urine test for pregnancy (women only) CT scan showed a normal appendix, suggested ovarian cyst Ultrasound of the pelvis shows 2 ovarian cysts, no signs of twisting of the ovary Vital signs. See below for your results today.   Medications prescribed:  Oxycodone - narcotic pain medication  DO NOT drive or perform any activities that require you to be awake and alert because this medicine can make you drowsy.   Naproxen - anti-inflammatory pain medication Do not exceed 500mg  naproxen every 12 hours, take with food  You have been prescribed an anti-inflammatory medication or NSAID. Take with food. Take smallest effective dose for the shortest duration needed for your pain. Stop taking if you experience stomach pain or vomiting.   Take any prescribed medications only as directed.  Home care instructions:  Follow any educational materials contained in this packet.  Follow-up instructions: Please follow-up with your OB/GYN in 1 week for further evaluation of your symptoms.    Return instructions:  SEEK IMMEDIATE MEDICAL ATTENTION IF: The pain does not go away or becomes severe  A temperature above 101F develops  Repeated vomiting occurs (multiple episodes)  The pain becomes localized to portions of the abdomen. The right side could possibly be appendicitis. In an adult, the left lower portion of the abdomen could be colitis or diverticulitis.  Blood is being passed in stools or vomit (bright red or black tarry stools)  You develop chest pain, difficulty breathing, dizziness or fainting, or become confused, poorly responsive, or inconsolable (young  children) If you have any other emergent concerns regarding your health  Additional Information: Abdominal (belly) pain can be caused by many things. Your caregiver performed an examination and possibly ordered blood/urine tests and imaging (CT scan, x-rays, ultrasound). Many cases can be observed and treated at home after initial evaluation in the emergency department. Even though you are being discharged home, abdominal pain can be unpredictable. Therefore, you need a repeated exam if your pain does not resolve, returns, or worsens. Most patients with abdominal pain don't have to be admitted to the hospital or have surgery, but serious problems like appendicitis and gallbladder attacks can start out as nonspecific pain. Many abdominal conditions cannot be diagnosed in one visit, so follow-up evaluations are very important.  Your vital signs today were: BP (!) 140/99   Pulse 80   Temp 98.2 F (36.8 C) (Oral)   Resp 18   LMP 12/18/2013   SpO2 100%  If your blood pressure (bp) was elevated above 135/85 this visit, please have this repeated by your doctor within one month. --------------

## 2023-05-06 NOTE — ED Provider Notes (Cosign Needed)
[place] EMERGENCY DEPARTMENT AT [facility] Provider Note   CSN: [id number] Arrival date & time: 05/06/23  2130     History  Chief Complaint  Patient presents with   Abdominal Pain    April Bowen is a 38 y.o. female.  Patient with history of partial hysterectomy and left oophorectomy --presents to the emergency department today for evaluation of right sided lower abdominal pain.  Symptoms started 2 days ago.  Symptoms were mild at onset.  Controlled with ibuprofen yesterday.  Worsening this morning.  She states that the pain is cramp-like, like a period cramp.  Sometimes she will have some vaginal spotting, last instance a couple of days ago, but nothing today.  No nausea, vomiting, change in appetite.  No diarrhea or constipation.  No dysuria or hematuria but has had some increased frequency.  No fevers.       Home Medications Prior to Admission medications   Medication Sig Start Date End Date Taking? Authorizing Provider  benzonatate (TESSALON) 200 MG capsule Take 1 capsule (200 mg total) by mouth 3 (three) times daily as needed for cough. 03/29/23   Karie Schwalbe, MD  doxycycline (VIBRA-TABS) 100 MG tablet Take 1 tablet (100 mg total) by mouth 2 (two) times daily. 03/29/23   Karie Schwalbe, MD  hydrochlorothiazide (HYDRODIURIL) 25 MG tablet TAKE 1 TABLET DAILY FOR BLOOD PRESSURE 09/07/22   Doreene Nest, NP  ondansetron (ZOFRAN) 4 MG tablet Take 4 mg by mouth every 8 (eight) hours as needed for nausea or vomiting.    [provider]  sertraline (ZOLOFT) 100 MG tablet TAKE 1 TABLET DAILY FOR ANXIETY AND DEPRESSION 07/13/22   Doreene Nest, NP  SUMAtriptan (IMITREX) 100 MG tablet Take 1 tablet (100 mg total) by mouth every 2 (two) hours as needed for migraine. Take 1 tablet by mouth at migraine onset, may repeat in 2 hours if headache persists or recurs.  Do not exceed 2 tablets in 24 hours. 07/21/21   Doreene Nest, NP  topiramate  (TOPAMAX) 50 MG tablet TAKE 1 TABLET (50 MG TOTAL) BY MOUTH 2 (TWO) TIMES DAILY. FOR HEADACHE PREVENTION. 10/16/21   Doreene Nest, NP      Allergies    Patient has no known allergies.    Review of Systems   Review of Systems  Physical Exam Updated Vital Signs BP (!) 146/101 (BP Location: Right Arm)   Pulse 91   Temp 98.2 F (36.8 C) (Oral)   Resp 20   LMP 12/18/2013   SpO2 100%  Physical Exam Vitals and nursing note reviewed.  Constitutional:      General: She is not in acute distress.    Appearance: She is well-developed.  HENT:     Head: Normocephalic and atraumatic.     Right Ear: External ear normal.     Left Ear: External ear normal.     Nose: Nose normal.  Eyes:     Conjunctiva/sclera: Conjunctivae normal.  Cardiovascular:     Rate and Rhythm: Normal rate and regular rhythm.     Heart sounds: No murmur heard. Pulmonary:     Effort: No respiratory distress.     Breath sounds: No wheezing, rhonchi or rales.  Abdominal:     Palpations: Abdomen is soft.     Tenderness: There is abdominal tenderness in the right lower quadrant. There is no guarding or rebound. Negative signs include Murphy's sign, Rovsing's sign, McBurney's sign and psoas sign.  Musculoskeletal:  Cervical back: Normal range of motion and neck supple.     Right lower leg: No edema.     Left lower leg: No edema.  Skin:    General: Skin is warm and dry.     Findings: No rash.  Neurological:     General: No focal deficit present.     Mental Status: She is alert. Mental status is at baseline.     Motor: No weakness.  Psychiatric:        Mood and Affect: Mood normal.     ED Results / Procedures / Treatments   Labs (all labs ordered are listed, but only abnormal results are displayed) Labs Reviewed  CBC WITH DIFFERENTIAL/PLATELET - Abnormal; Notable for the following components:      Result Value   WBC 10.6 (*)    Neutro Abs 8.1 (*)    All other components within normal limits   COMPREHENSIVE METABOLIC PANEL - Abnormal; Notable for the following components:   Calcium 8.6 (*)    AST 12 (*)    All other components within normal limits  URINALYSIS, ROUTINE W REFLEX MICROSCOPIC - Abnormal; Notable for the following components:   Color, Urine COLORLESS (*)    Hgb urine dipstick TRACE (*)    All other components within normal limits  LIPASE, BLOOD  HCG, SERUM, QUALITATIVE    EKG None  Radiology US PELVIC COMPLETE W TRANSVAGINAL AND TORSION R/O Result Date: 05/06/2023 CLINICAL DATA:  Abnormal CT, right lower quadrant pain, partial hysterectomy and left salpingo oophorectomy. EXAM: TRANSABDOMINAL AND TRANSVAGINAL ULTRASOUND OF PELVIS TECHNIQUE: Both transabdominal and transvaginal ultrasound examinations of the pelvis were performed. Transabdominal technique was performed for global imaging of the pelvis including uterus, ovaries, adnexal regions, and pelvic cul-de-sac. It was necessary to proceed with endovaginal exam following the transabdominal exam to visualize the uterus, endometrium, ovaries and adnexal regions. COMPARISON:  CT abdomen pelvis 05/06/2023. FINDINGS: Uterus Surgically absent. Endometrium Surgically absent. Right ovary Measurements: 5.0 x 3.7 x 3.7 cm = volume: 36.1 mL. Contains a mildly heterogeneous and mildly hyperechoic lesion measuring 3.3 x 2.7 x 2.8 cm. No associated internal vascularity. Additionally, there are 2 adjacent anechoic lesions with increased through transmission versus a single lesion with an internal septation, overall measuring 3.0 x 1.6 x 1.8 cm. Left ovary Surgically absent. Other findings Trace pelvic free fluid. IMPRESSION: 1. Hyperechoic nonvascular lesion in the right ovary may represent a hemorrhagic cyst. Recommend follow-up ultrasound in 6-12 weeks to ensure resolution. 2. Bilobed right ovarian cyst.  No follow-up necessary. 3. Hysterectomy and left salpingo oophorectomy. Electronically Signed   By: Leanna Battles M.D.   On:  05/06/2023 13:06   CT ABDOMEN PELVIS W CONTRAST Addendum Date: 05/06/2023 ADDENDUM REPORT: 05/06/2023 12:39 ADDENDUM: Reportedly the patient is status post hysterectomy. Electronically Signed   By: Lupita Raider M.D.   On: 05/06/2023 12:39   Result Date: 05/06/2023 CLINICAL DATA:  Acute right lower quadrant abdominal pain. EXAM: CT ABDOMEN AND PELVIS WITH CONTRAST TECHNIQUE: Multidetector CT imaging of the abdomen and pelvis was performed using the standard protocol following bolus administration of intravenous contrast. RADIATION DOSE REDUCTION: This exam was performed according to the departmental dose-optimization program which includes automated exposure control, adjustment of the mA and/or kV according to patient size and/or use of iterative reconstruction technique. CONTRAST:  80mL OMNIPAQUE IOHEXOL 300 MG/ML  SOLN COMPARISON:  November 15, 2021. FINDINGS: Lower chest: No acute abnormality. Hepatobiliary: No focal liver abnormality is seen. No gallstones, gallbladder  wall thickening, or biliary dilatation. Pancreas: Unremarkable. No pancreatic ductal dilatation or surrounding inflammatory changes. Spleen: Normal in size without focal abnormality. Adrenals/Urinary Tract: Adrenal glands are unremarkable. Kidneys are normal, without renal calculi, focal lesion, or hydronephrosis. Bladder is unremarkable. Stomach/Bowel: Stomach is within normal limits. Appendix appears normal. No evidence of bowel wall thickening, distention, or inflammatory changes. Vascular/Lymphatic: No significant vascular findings are present. No enlarged abdominal or pelvic lymph nodes. Reproductive: Uterus is unremarkable. 4.7 cm right adnexal complex cystic lesion is noted. Other: Small fat containing periumbilical hernia.  No ascites. Musculoskeletal: No acute or significant osseous findings. IMPRESSION: 4.7 cm complex right adnexal lesion is noted concerning for possible hemorrhagic cyst or other pathology. Pelvic ultrasound is  recommended for further evaluation. Electronically Signed: By: Lupita Raider M.D. On: 05/06/2023 11:23    Procedures Procedures    Medications Ordered in ED Medications  ketorolac (TORADOL) 15 MG/ML injection 15 mg (15 mg Intravenous Given 05/06/23 0938)  iohexol (OMNIPAQUE) 300 MG/ML solution 100 mL (80 mLs Intravenous Contrast Given 05/06/23 1104)    ED Course/ Medical Decision Making/ A&P    Patient seen and examined. History obtained directly from patient. Work-up including labs, imaging, EKG ordered in triage, if performed, were reviewed.    Labs/EKG: Independently reviewed and interpreted.  This included: CBC, CMP, lipase, UA, pregnancy   Imaging: None ordered initially, will need CT abdomen pelvis to evaluate for possibility of appendicitis  Medications/Fluids: Ordered: IV Toradol  Most recent vital signs reviewed and are as follows: BP (!) 146/101 (BP Location: Right Arm)   Pulse 91   Temp 98.2 F (36.8 C) (Oral)   Resp 20   LMP 12/18/2013   SpO2 100%   Initial impression: Right lower quadrant abdominal pain  10:30 AM Reassessment performed. Patient appears more comfortable Toradol.  States that it helped.  Currently comfortable.  Labs personally reviewed and interpreted including: CBC with slightly elevated white blood cell count and neutrophils; CMP unremarkable; lipase normal; pregnancy negative; UA unremarkable without compelling signs of infection.  Imaging: Ordered CT abdomen pelvis  Reviewed pertinent lab work and imaging with patient at bedside. Questions answered.   Most current vital signs reviewed and are as follows: BP (!) 146/101 (BP Location: Right Arm)   Pulse 91   Temp 98.2 F (36.8 C) (Oral)   Resp 20   LMP 12/18/2013   SpO2 100%   Plan: Awaiting CT results  1:42 PM Reassessment performed. Patient appears comfortable on multiple rechecks.  Imaging personally visualized and interpreted including: CT with normal appendix, showed likely  ovarian cyst, recommended pelvic ultrasound.  This was discussed with patient.  She agrees to proceed with ultrasound.  Ultrasound results reviewed: Shows hemorrhagic cyst and septated cyst on the right ovary  Reviewed pertinent lab work and imaging with patient at bedside. Questions answered.   Most current vital signs reviewed and are as follows: BP (!) 140/99   Pulse 80   Temp 98.2 F (36.8 C) (Oral)   Resp 18   LMP 12/18/2013   SpO2 100%   Plan: Discharge to home with pain control, OB/GYN follow-up.  Prescriptions written for: Oxycodone 5 mg # 5 tablets, naproxen  ED return instructions discussed: The patient was urged to return to the Emergency Department immediately with worsening of current symptoms, worsening abdominal pain, persistent vomiting, blood noted in stools, fever, or any other concerns. The patient verbalized understanding.   Follow-up instructions discussed: Patient encouraged to follow-up with their OB/GYN in 1  week.                                Medical Decision Making Amount and/or Complexity of Data Reviewed Labs: ordered. Radiology: ordered.  Risk Prescription drug management.   For this patient's complaint of abdominal pain, the following conditions were considered on the differential diagnosis: gastritis/PUD, enteritis/duodenitis, appendicitis, cholelithiasis/cholecystitis, cholangitis, pancreatitis, ruptured viscus, colitis, diverticulitis, small/large bowel obstruction, proctitis, cystitis, pyelonephritis, ureteral colic, aortic dissection, aortic aneurysm. In women, ectopic pregnancy, pelvic inflammatory disease, ovarian cysts, and tubo-ovarian abscess were also considered. Atypical chest etiologies were also considered including ACS, PE, and pneumonia.  After evaluation with CT and pelvic ultrasound, symptoms most likely being caused by her ovarian cysts on the right side.  Symptoms are not consistent with ovarian torsion.  Pain is well-controlled  and patient without any vomiting.  She has appropriate OB/GYN follow-up.  She has a history of cyst in the past that resolved spontaneously.  The patient's vital signs, pertinent lab work and imaging were reviewed and interpreted as discussed in the ED course. Hospitalization was considered for further testing, treatments, or serial exams/observation. However as patient is well-appearing, has a stable exam, and reassuring studies today, I do not feel that they warrant admission at this time. This plan was discussed with the patient who verbalizes agreement and comfort with this plan and seems reliable and able to return to the Emergency Department with worsening or changing symptoms.          Final Clinical Impression(s) / ED Diagnoses Final diagnoses:  Cyst of right ovary  RLQ abdominal pain    Rx / DC Orders ED Discharge Orders          Ordered    oxyCODONE (OXY IR/ROXICODONE) 5 MG immediate release tablet  Every 6 hours PRN        05/06/23 1340    naproxen (NAPROSYN) 500 MG tablet  2 times daily        05/06/23 1340              Renne Crigler, [place] 05/06/23 1346

## 2023-05-06 NOTE — ED Triage Notes (Signed)
Right side abdominal pain (over my ovaries). Started on Friday, has hx partial hysterectomy. No fevers. No difficulty voiding or having bm's.

## 2023-05-09 ENCOUNTER — Ambulatory Visit: Payer: Self-pay | Admitting: Primary Care

## 2023-05-09 ENCOUNTER — Telehealth: Payer: Self-pay | Admitting: Primary Care

## 2023-05-09 NOTE — Telephone Encounter (Signed)
 Copied from CRM [id number]. Topic: Clinical - Medication Refill >> May 09, 2023  8:18 AM Kara C wrote: Most Recent Primary Care Visit:  Provider: JIMMY ADE I  Department: [facility]  Visit Type: ACUTE  Date: 03/29/2023   Medication: Amlodipine  5MG  Tablet   Has the patient contacted their pharmacy? Yes, the pharmacy said they did not receive a response from the provider about this RX refill. (Agent: If no, request that the patient contact the pharmacy for the refill. If patient does not wish to contact the pharmacy document the reason why and proceed with request.) (Agent: If yes, when and what did the pharmacy advise?)   Is this the correct pharmacy for this prescription? Yes If no, delete pharmacy and type the correct one.  This is the patient's preferred pharmacy:  [facility] [phone] [facility], [place] - [address] [place] [address] Phone: [phone] Fax: [phone]   Has the prescription been filled recently? No   Is the patient out of the medication? Yes   Has the patient been seen for an appointment in the last year OR does the patient have an upcoming appointment? Yes   Can we respond through MyChart? Yes   Agent: Please be advised that Rx refills may take up to 3 business days. We ask that you follow-up with your pharmacy.

## 2023-05-09 NOTE — Telephone Encounter (Signed)
This telephone encounter has been converter to a nurse triage - see nurse triage note

## 2023-05-09 NOTE — Telephone Encounter (Signed)
Called and spoke with patient, scheduled BP f/u appt.

## 2023-05-09 NOTE — Telephone Encounter (Signed)
 Please schedule patient for an office visit.  Have her monitor her blood pressure daily at home in the meantime.

## 2023-05-09 NOTE — Telephone Encounter (Signed)
 Attempted to call pt back regarding Rx refill request for amlodipine : after checking pt's current medication list pt is no longer on this medication. Pt current list shows pt should be taking hydrochlorothiazide .  Called to inquire with pt which medication she is currently taking and which medication is she currently requesting refill.  No answer: left voicemail to us  back.  Will attempt to reach pt again.

## 2023-05-09 NOTE — Telephone Encounter (Signed)
  Chief Complaint: amlodipine  refill/restart Symptoms: occasional HA Pertinent Negatives: Patient denies ability to take a BP at this time. Disposition: [] ED /[] Urgent Care (no appt availability in office) / [] Appointment(In office/virtual)/ []  [facility]/ [] Home Care/ [] Refused Recommended Disposition /[] [facility]/ [x]  Follow-up with PCP Additional Notes: Pt states that she and her PCP had stopped the amlodipine  d/t potentially not needing it. Per pt she was recently seen in the ED for OB/GYN issues and her BP was high. Per pt she has been experiencing some HA's that are not like her migraines, that she feels is like her BP head aches. Pt advised she would like to restart her Amlodipine . Pt does not have a means to check her BP at this time. Advised to check her BP ASAP. Reason for Disposition  [1] Caller has NON-URGENT medicine question about med that PCP prescribed AND [2] triager unable to answer question  Answer Assessment - Initial Assessment Questions 1. DRUG NAME: What medicine do you need to have refilled?     amlodipine  2. REFILLS REMAINING: How many refills are remaining? (Note: The label on the medicine or pill bottle will show how many refills are remaining. If there are no refills remaining, then a renewal may be needed.)     No refills remaining, my pcp wanted me to see if I could go off the medication, so we were trailing that 4. PRESCRIBING HCP: Who prescribed it? Reason: If prescribed by specialist, call should be referred to that group.     [facility] 5. SYMPTOMS: Do you have any symptoms?     Pt states that she has been experiencing HTN system, HA, states that she has a different kind of headache with a rising BP 6. PREGNANCY: Is there any chance that you are pregnant? When was your last menstrual period?     denies  Protocols used: Medication Refill and Renewal Call-A-AH

## 2023-05-11 ENCOUNTER — Telehealth: Payer: Self-pay

## 2023-05-11 NOTE — Transitions of Care (Post Inpatient/ED Visit) (Cosign Needed)
 pt was seen [facility] ED on 05/06/23 with rt sided crampy continuous pain ;pt had blood filled ovarian cyst on rt. pt still having pain at pain level of 7. pt spoke with [facility] today andpt has appt iwth them on 05/14/23. Pt will cb if needs appt with PCP after seeing OB GYN. UC & ED precautions given and pt voiced understanding. Sending note to MARLA Gaskins NP. Pt already has BP FU scheduled with MARLA Gaskins NP on 05/15/23 at 7:20.        05/11/2023  Name: April Bowen MRN: [id number] DOB: 06-05-1985  Today's TOC FU Call Status: Today's TOC FU Call Status:: Successful TOC FU Call Completed TOC FU Call Complete Date: 05/11/23 Patient's Name and Date of Birth confirmed.  Transition Care Management Follow-up Telephone Call Date of Discharge: 05/06/23 Discharge Facility: [facility] ([facility]) Type of Discharge: Emergency Department Reason for ED Visit: Other: (pt was seen [facility] ED on 05/06/23 with rt sided crampy continuous pain ;pt had blood filled ovarian cyst on rt.  pt still having pain at pain level of 7. pt spoke with [facility] today andpt has appt iwth them on 05/14/23.) How have you been since you were released from the hospital?: Same Any questions or concerns?: No  Items Reviewed: Did you receive and understand the discharge instructions provided?: Yes Medications obtained,verified, and reconciled?: Partial Review Completed Reason for Partial Mediation Review: pt is taking Naproxen  500 mg bid that was given at ED and that has helped the pain to be tolerable. pt has not had to take oxycodone  5 mg. Any new allergies since your discharge?: No Dietary orders reviewed?: NA Do you have support at home?: Yes People in Home: parent(s) Name of Support/Comfort Primary Source: [place]  Medications Reviewed Today: Medications Reviewed Today   Medications were not reviewed in this encounter     Home Care and Equipment/Supplies: Were Home Health Services Ordered?:  NA Any new equipment or medical supplies ordered?: NA  Functional Questionnaire: Do you need assistance with bathing/showering or dressing?: No Do you need assistance with meal preparation?: No Do you need assistance with eating?: No Do you have difficulty maintaining continence: No Do you need assistance with getting out of bed/getting out of a chair/moving?: No Do you have difficulty managing or taking your medications?: No  Follow up appointments reviewed: PCP Follow-up appointment confirmed?: NA Specialist Hospital Follow-up appointment confirmed?: Yes Date of Specialist follow-up appointment?: 05/14/23 Follow-Up Specialty Provider:: [facility] Do you need transportation to your follow-up appointment?: No Do you understand care options if your condition(s) worsen?: Yes-patient verbalized understanding    SIGNATURE Laray Arenas, LPN

## 2023-05-11 NOTE — Telephone Encounter (Signed)
 Noted.

## 2023-05-15 ENCOUNTER — Ambulatory Visit: Payer: 59 | Admitting: Primary Care

## 2023-05-15 ENCOUNTER — Encounter: Payer: Self-pay | Admitting: Primary Care

## 2023-05-15 VITALS — BP 138/86 | HR 85 | Temp 97.1°F | Ht 62.0 in | Wt 154.0 lb

## 2023-05-15 DIAGNOSIS — G43009 Migraine without aura, not intractable, without status migrainosus: Secondary | ICD-10-CM

## 2023-05-15 DIAGNOSIS — I10 Essential (primary) hypertension: Secondary | ICD-10-CM | POA: Diagnosis not present

## 2023-05-15 MED ORDER — TOPIRAMATE 100 MG PO TABS: 100.0000 mg | ORAL_TABLET | Freq: Every day | ORAL | 0 refills | Status: AC

## 2023-05-15 MED ORDER — AMLODIPINE BESYLATE 2.5 MG PO TABS: 2.5000 mg | ORAL_TABLET | Freq: Every day | ORAL | 0 refills | Status: DC

## 2023-05-15 NOTE — Progress Notes (Signed)
Subjective:    Patient ID: April Bowen, female    DOB: Nov 28, 1985, 38 y.o.   MRN: [id number]  Hypertension Associated symptoms include headaches. Pertinent negatives include no chest pain or shortness of breath.    April Bowen is a very pleasant 38 y.o. female with a history of hypertension, migraines, anxiety and depression who presents today to discuss hypertension and migraines.   1) Hypertension: Currently managed on hydrochlorothiazide 25 mg daily.  Previously managed on amlodipine 5 mg daily and conjunction with hydrochlorothiazide; however, amlodipine was discontinued in April 2024 due to symptoms of hypotension and dizziness.  She notified us via MyChart on 05/09/2023 that she had been taking amlodipine 5 mg along with hydrochlorothiazide 25 mg every day to every other day.  She request refills that she was asked to come in for an office visit for further evaluation.  Today she discusses that she has been taking amlodipine 5 mg since April 2024, is taking Monday through Friday, skips Saturday and Sunday. She doesn't take her amlodipine daily as she sees readings in the 90s systolic with dizziness. She ran out of amlodipine 2 weeks ago. She is checking her BP at home which is running 140s/80s.   BP Readings from Last 3 Encounters:  05/15/23 138/86  05/06/23 (!) 127/90  03/29/23 132/80   2) Migraines: Chronic for years. 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 She is in her own office, can turn off the fluorescent lights. Wears blue light glasses.   She's been taking BC Powder regularly. She is taking Topamax 100 mg HS but continues to experience headaches twice weekly, sometimes no headaches for a week.    Review of Systems  Eyes:  Positive for photophobia.  Respiratory:  Negative for shortness of  breath.   Cardiovascular:  Negative for chest pain.  Neurological:  Positive for headaches.         Past Medical History:  Diagnosis Date   Depression    Ectopic pregnancy    Ectopic pregnancy, tubal 10/31/2010   Epistaxis 05/20/2020   Hypertension    Migraines    Ovarian cyst    PCOS (polycystic ovarian syndrome)     Social History   Socioeconomic History   Marital status: Single    Spouse name: Not on file   Number of children: Not on file   Years of education: Not on file   Highest education level: Not on file  Occupational History   Not on file  Tobacco Use   Smoking status: Never   Smokeless tobacco: Never  Vaping Use   Vaping status: Never Used  Substance and Sexual Activity   Alcohol use: No   Drug use: No   Sexual activity: Yes  Other Topics Concern   Not on file  Social History Narrative   Moved from [place], from [place].   Works with [employer].   Single.   1 child.    Social Drivers of Corporate investment banker Strain: Not on file  Food Insecurity: Not on file  Transportation Needs: Not on file  Physical Activity: Not on file  Stress: Not on file  Social Connections: Unknown (09/12/2021)   Received from [facility], [facility]   Social Network    Social Network: Not on file  Intimate Partner Violence: Unknown (  09/12/2021)   Received from [facility], [facility]   HITS    Physically Hurt: Not on file    Insult or Talk Down To: Not on file    Threaten Physical Harm: Not on file    Scream or Curse: Not on file    Past Surgical History:  Procedure Laterality Date   ABDOMINAL HYSTERECTOMY  2018   one ovary remaining   WISDOM TOOTH EXTRACTION      Family History  Problem Relation Age of Onset   Hypertension Mother    Arthritis Mother    Hypercholesterolemia Mother    Hypertension Father    Arthritis Father    Hypercholesterolemia Father    Arthritis Maternal Grandmother    Bone cancer Paternal Grandmother     No  Known Allergies  Current Outpatient Medications on File Prior to Visit  Medication Sig Dispense Refill   hydrochlorothiazide (HYDRODIURIL) 25 MG tablet TAKE 1 TABLET DAILY FOR BLOOD PRESSURE 90 tablet 2   sertraline (ZOLOFT) 100 MG tablet TAKE 1 TABLET DAILY FOR ANXIETY AND DEPRESSION 90 tablet 3   topiramate (TOPAMAX) 50 MG tablet Take 50 mg by mouth daily.     SUMAtriptan (IMITREX) 100 MG tablet Take 1 tablet (100 mg total) by mouth every 2 (two) hours as needed for migraine. Take 1 tablet by mouth at migraine onset, may repeat in 2 hours if headache persists or recurs.  Do not exceed 2 tablets in 24 hours. (Patient not taking: Reported on 05/15/2023) 10 tablet 0   No current facility-administered medications on file prior to visit.    BP 138/86   Pulse 85   Temp (!) 97.1 F (36.2 C) (Temporal)   Ht 5\' 2"  (1.575 m)   Wt 154 lb (69.9 kg)   LMP 12/18/2013   SpO2 98%   BMI 28.17 kg/m  Objective:   Physical Exam Cardiovascular:     Rate and Rhythm: Normal rate and regular rhythm.  Pulmonary:     Effort: Pulmonary effort is normal.     Breath sounds: Normal breath sounds.  Musculoskeletal:     Cervical back: Neck supple.  Skin:    General: Skin is warm and dry.  Neurological:     Mental Status: She is alert and oriented to person, place, and time.  Psychiatric:        Mood and Affect: Mood normal.           Assessment & Plan:  Essential hypertension Assessment & Plan: Above goal today, also on recheck.  Continue hydrochlorothiazide 25 mg daily, add amlodipine 2.5 mg daily.  Requested that she take the amlodipine every day and to update if she experiences hypotension/dizziness.  Follow-up in April during annual CPE.  Orders: -     amLODIPine Besylate; Take 1 tablet (2.5 mg total) by mouth daily. for blood pressure.  Dispense: 90 tablet; Refill: 0  Migraine without aura and without status migrainosus, not intractable Assessment & Plan: Deteriorated.  Add Topamax  50 mg in a.m., continue 100 mg at bedtime. Continue sumatriptan 100 mg as needed.  She will update.  Orders: -     Topiramate; Take 1 tablet (100 mg total) by mouth at bedtime. For headache prevention  Dispense: 90 tablet; Refill: 0        April Nest, NP

## 2023-05-15 NOTE — Assessment & Plan Note (Signed)
Deteriorated.  Add Topamax 50 mg in a.m., continue 100 mg at bedtime. Continue sumatriptan 100 mg as needed.  She will update.

## 2023-05-15 NOTE — Assessment & Plan Note (Signed)
Above goal today, also on recheck.  Continue hydrochlorothiazide 25 mg daily, add amlodipine 2.5 mg daily.  Requested that she take the amlodipine every day and to update if she experiences hypotension/dizziness.  Follow-up in April during annual CPE.

## 2023-05-15 NOTE — Patient Instructions (Signed)
Start taking amlodipine 2.5 mg daily for blood pressure.  Continue hydrochlorothiazide for blood pressure.  We increased the dose of your Topamax.  Take 50 mg in the morning, 100 mg at bedtime for headache prevention.  It was a pleasure to see you today!

## 2023-05-16 ENCOUNTER — Encounter: Payer: Self-pay | Admitting: Obstetrics and Gynecology

## 2023-07-10 ENCOUNTER — Emergency Department (HOSPITAL_BASED_OUTPATIENT_CLINIC_OR_DEPARTMENT_OTHER)
Admission: EM | Admit: 2023-07-10 | Discharge: 2023-07-10 | Disposition: A | Discharge status: Home / Self Care | Attending: Emergency Medicine | Admitting: Emergency Medicine

## 2023-07-10 ENCOUNTER — Emergency Department (HOSPITAL_BASED_OUTPATIENT_CLINIC_OR_DEPARTMENT_OTHER)

## 2023-07-10 ENCOUNTER — Encounter (HOSPITAL_BASED_OUTPATIENT_CLINIC_OR_DEPARTMENT_OTHER): Payer: Self-pay

## 2023-07-10 ENCOUNTER — Other Ambulatory Visit: Payer: Self-pay

## 2023-07-10 DIAGNOSIS — I159 Secondary hypertension, unspecified: Secondary | ICD-10-CM

## 2023-07-10 DIAGNOSIS — Z79899 Other long term (current) drug therapy: Secondary | ICD-10-CM | POA: Diagnosis not present

## 2023-07-10 DIAGNOSIS — I10 Essential (primary) hypertension: Secondary | ICD-10-CM | POA: Insufficient documentation

## 2023-07-10 DIAGNOSIS — R202 Paresthesia of skin: Secondary | ICD-10-CM | POA: Insufficient documentation

## 2023-07-10 LAB — CBC WITH DIFFERENTIAL/PLATELET
Abs Immature Granulocytes: 0.02 10*3/uL (ref 0.00–0.07)
Basophils Absolute: 0 10*3/uL (ref 0.0–0.1)
Basophils Relative: 0 %
Eosinophils Absolute: 0.2 10*3/uL (ref 0.0–0.5)
Eosinophils Relative: 2 %
HCT: 38.8 % (ref 36.0–46.0)
Hemoglobin: 13 g/dL (ref 12.0–15.0)
Immature Granulocytes: 0 %
Lymphocytes Relative: 28 %
Lymphs Abs: 2.3 10*3/uL (ref 0.7–4.0)
MCH: 31.4 pg (ref 26.0–34.0)
MCHC: 33.5 g/dL (ref 30.0–36.0)
MCV: 93.7 fL (ref 80.0–100.0)
Monocytes Absolute: 0.4 10*3/uL (ref 0.1–1.0)
Monocytes Relative: 5 %
Neutro Abs: 5.3 10*3/uL (ref 1.7–7.7)
Neutrophils Relative %: 65 %
Platelets: 150 10*3/uL (ref 150–400)
RBC: 4.14 MIL/uL (ref 3.87–5.11)
RDW: 12.6 % (ref 11.5–15.5)
WBC: 8.2 10*3/uL (ref 4.0–10.5)
nRBC: 0 % (ref 0.0–0.2)

## 2023-07-10 LAB — BASIC METABOLIC PANEL WITH GFR
Anion gap: 8 (ref 5–15)
BUN: 9 mg/dL (ref 6–20)
CO2: 25 mmol/L (ref 22–32)
Calcium: 9 mg/dL (ref 8.9–10.3)
Chloride: 105 mmol/L (ref 98–111)
Creatinine, Ser: 0.75 mg/dL (ref 0.44–1.00)
GFR, Estimated: >60 mL/min (ref >60)
Glucose, Bld: 90 mg/dL (ref 70–99)
Potassium: 3.7 mmol/L (ref 3.5–5.1)
Sodium: 138 mmol/L (ref 135–145)

## 2023-07-10 MED ORDER — ACETAMINOPHEN 325 MG PO TABS
650.0000 mg | ORAL_TABLET | Freq: Once | ORAL | Status: AC
  Administered 2023-07-10: 650 mg via ORAL
  Filled 2023-07-10: qty 2

## 2023-07-10 NOTE — ED Provider Notes (Signed)
 [place] EMERGENCY DEPARTMENT AT [facility] Provider Note   CSN: [id number] Arrival date & time: 07/10/23  8295     History  Chief Complaint  Patient presents with   Numbness    April Bowen is a 38 y.o. female.  Patient here with elevated blood pressure with some tingling in her bilateral lower face left arm now resolved.  History of migraines not having any headaches currently.  She is asymptomatic now.  She has been dealing with some elevated blood pressures here recently.  Working on some adjustments of her blood pressure meds recently as well.  Denies any chest pain shortness of breath vision loss speech changes weakness.  Denies any coordination issues.  Denies any fever cough sputum production.  The history is provided by the patient.       Home Medications Prior to Admission medications   Medication Sig Start Date End Date Taking? Authorizing Provider  amLODipine (NORVASC) 2.5 MG tablet Take 1 tablet (2.5 mg total) by mouth daily. for blood pressure. 05/15/23   Doreene Nest, NP  hydrochlorothiazide (HYDRODIURIL) 25 MG tablet TAKE 1 TABLET DAILY FOR BLOOD PRESSURE 09/07/22   Doreene Nest, NP  sertraline (ZOLOFT) 100 MG tablet TAKE 1 TABLET DAILY FOR ANXIETY AND DEPRESSION 07/13/22   Doreene Nest, NP  SUMAtriptan (IMITREX) 100 MG tablet Take 1 tablet (100 mg total) by mouth every 2 (two) hours as needed for migraine. Take 1 tablet by mouth at migraine onset, may repeat in 2 hours if headache persists or recurs.  Do not exceed 2 tablets in 24 hours. Patient not taking: Reported on 05/15/2023 07/21/21   Doreene Nest, NP  topiramate (TOPAMAX) 100 MG tablet Take 1 tablet (100 mg total) by mouth at bedtime. For headache prevention 05/15/23   Doreene Nest, NP  topiramate (TOPAMAX) 50 MG tablet Take 50 mg by mouth daily.    [provider]      Allergies    Patient has no known allergies.    Review of Systems   Review of  Systems  Physical Exam Updated Vital Signs BP (!) 143/94 (BP Location: Right Arm)   Pulse 92   Temp 98.2 F (36.8 C)   Resp 16   Ht 5\' 2"  (1.575 m)   Wt 69.4 kg   LMP 12/18/2013   SpO2 100%   BMI 27.98 kg/m  Physical Exam Vitals and nursing note reviewed.  Constitutional:      General: She is not in acute distress.    Appearance: She is well-developed. She is not ill-appearing.  HENT:     Head: Normocephalic and atraumatic.     Nose: Nose normal.     Mouth/Throat:     Mouth: Mucous membranes are moist.  Eyes:     Extraocular Movements: Extraocular movements intact.     Conjunctiva/sclera: Conjunctivae normal.     Pupils: Pupils are equal, round, and reactive to light.  Cardiovascular:     Rate and Rhythm: Normal rate and regular rhythm.     Pulses: Normal pulses.     Heart sounds: Normal heart sounds. No murmur heard. Pulmonary:     Effort: Pulmonary effort is normal. No respiratory distress.     Breath sounds: Normal breath sounds.  Abdominal:     General: Abdomen is flat.     Palpations: Abdomen is soft.     Tenderness: There is no abdominal tenderness.  Musculoskeletal:        General: No  swelling. Normal range of motion.     Cervical back: Normal range of motion and neck supple.  Skin:    General: Skin is warm and dry.     Capillary Refill: Capillary refill takes less than 2 seconds.  Neurological:     General: No focal deficit present.     Mental Status: She is alert and oriented to person, place, and time.     Cranial Nerves: No cranial nerve deficit.     Sensory: No sensory deficit.     Motor: No weakness.     Coordination: Coordination normal.     Gait: Gait normal.     Comments: 5+ out of 5 strength throughout, normal sensation, no drift, normal finger-to-nose finger, normal speech, normal coordination, normal visual fields  Psychiatric:        Mood and Affect: Mood normal.     ED Results / Procedures / Treatments   Labs (all labs ordered are  listed, but only abnormal results are displayed) Labs Reviewed  CBC WITH DIFFERENTIAL/PLATELET  BASIC METABOLIC PANEL WITH GFR    EKG EKG Interpretation Date/Time:  Tuesday July 10 2023 07:50:19 EDT Ventricular Rate:  85 PR Interval:  177 QRS Duration:  77 QT Interval:  360 QTC Calculation: 428 R Axis:   60  Text Interpretation: Sinus rhythm Confirmed by Virgina Norfolk [phone]) on 07/10/2023 7:56:23 AM  Radiology CT Head Wo Contrast Result Date: 07/10/2023 CLINICAL DATA:  Headache with increasing frequency or severity. History of migraine EXAM: CT HEAD WITHOUT CONTRAST TECHNIQUE: Contiguous axial images were obtained from the base of the skull through the vertex without intravenous contrast. RADIATION DOSE REDUCTION: This exam was performed according to the departmental dose-optimization program which includes automated exposure control, adjustment of the mA and/or kV according to patient size and/or use of iterative reconstruction technique. COMPARISON:  10/02/2003 FINDINGS: Brain: No evidence of acute infarction, hemorrhage, hydrocephalus, extra-axial collection or mass lesion/mass effect. Vascular: No hyperdense vessel or unexpected calcification. Skull: Normal. Negative for fracture or focal lesion. Sinuses/Orbits: No acute finding. IMPRESSION: Negative head CT. Electronically Signed   By: Tiburcio Pea M.D.   On: 07/10/2023 08:28    Procedures Procedures    Medications Ordered in ED Medications  acetaminophen (TYLENOL) tablet 650 mg (650 mg Oral Given 07/10/23 0850)    ED Course/ Medical Decision Making/ A&P                                 Medical Decision Making Amount and/or Complexity of Data Reviewed Labs: ordered. Radiology: ordered.  Risk OTC drugs.   April Bowen is here with high blood pressure tingling.  Normal vitals.  No fever.  Blood pressure was mildly elevated at 143/94 but on reevaluation 120 systolic.  She has a normal neurological exam.  No  numbness weakness.  Normal speech normal coordination.  She woke up with some tingling to the bilateral lower face left arm has since resolved that she has been here.  She has been dealing with some elevated blood pressure.  She has chronic migraine she has had a fairly long migraine process here recently but she is not endorsing a headache now.  Differential diagnosis could be electrolyte abnormality, may be aura migraine, stress related process.  Ultimately will get a CT of the head, check basic labs EKG.  I have very low suspicion for stroke MS or other acute process at this time as she  has normal exam.  EKG shows sinus rhythm.  No ischemic changes.  Head CT is unremarkable.  Lab work from my review interpretation shows no significant leukocytosis anemia electrolyte abnormality kidney injury.  Overall continue checking blood pressure at home and follow-up with primary care doctor.  Discharged in good condition.  This chart was dictated using voice recognition software.  Despite best efforts to proofread,  errors can occur which can change the documentation meaning.         Final Clinical Impression(s) / ED Diagnoses Final diagnoses:  Paresthesia  Secondary hypertension    Rx / DC Orders ED Discharge Orders     None         Virgina Norfolk, DO 07/10/23 0915

## 2023-07-10 NOTE — ED Triage Notes (Signed)
 Pt POV from home w/ hx of migraine c/o left arm numbness/tingling and bilateral lower face tingling. Pt woke up w/ s/s at 5:45am. States she has recently had BP med dose adjustments since Feb, has been self adjusting BP dose since last week because she was still getting high BP readings at home.

## 2023-07-13 ENCOUNTER — Ambulatory Visit (INDEPENDENT_AMBULATORY_CARE_PROVIDER_SITE_OTHER): Payer: BC Managed Care – PPO | Admitting: Primary Care

## 2023-07-13 ENCOUNTER — Encounter: Payer: Self-pay | Admitting: Primary Care

## 2023-07-13 VITALS — BP 124/72 | HR 100 | Temp 98.1°F | Ht 62.0 in | Wt 155.0 lb

## 2023-07-13 DIAGNOSIS — Z0000 Encounter for general adult medical examination without abnormal findings: Secondary | ICD-10-CM

## 2023-07-13 DIAGNOSIS — G43709 Chronic migraine without aura, not intractable, without status migrainosus: Secondary | ICD-10-CM

## 2023-07-13 DIAGNOSIS — F4323 Adjustment disorder with mixed anxiety and depressed mood: Secondary | ICD-10-CM

## 2023-07-13 DIAGNOSIS — I10 Essential (primary) hypertension: Secondary | ICD-10-CM

## 2023-07-13 MED ORDER — SUMATRIPTAN SUCCINATE 100 MG PO TABS: ORAL_TABLET | ORAL | 0 refills | Status: DC

## 2023-07-13 NOTE — Assessment & Plan Note (Signed)
 Controlled overall.  Continue Topamax 150 mg daily for headache prevention. Continue sumatriptan 100 mg PRN.

## 2023-07-13 NOTE — Assessment & Plan Note (Signed)
 Immunizations UTD. Pap smear UTD.  Follows with GYN  Discussed the importance of a healthy diet and regular exercise in order for weight loss, and to reduce the risk of further co-morbidity.  Exam stable. Labs pending.  Follow up in 1 year for repeat physical.

## 2023-07-13 NOTE — Progress Notes (Signed)
 Subjective:    Patient ID: April Bowen, female    DOB: [birth year], 38 y.o.   MRN: [id number]  HPI  April Bowen is a very pleasant 38 y.o. female who presents today for complete physical and follow up of chronic conditions.  Immunizations: -Tetanus: Completed in 2018  Diet: Fair diet.  Exercise: No regular exercise.  Eye exam: Completed 2 years ago  Dental exam: Completes semi-annually    Pap Smear: Completed in November 2022, follows with GYN.   BP Readings from Last 3 Encounters:  07/13/23 124/72  07/10/23 110/87  05/15/23 138/86      Review of Systems  Constitutional:  Negative for unexpected weight change.  HENT:  Negative for rhinorrhea.   Respiratory:  Negative for cough and shortness of breath.   Cardiovascular:  Negative for chest pain.  Gastrointestinal:  Negative for constipation and diarrhea.  Genitourinary:  Negative for difficulty urinating.  Musculoskeletal:  Negative for arthralgias and myalgias.  Skin:  Negative for rash.  Allergic/Immunologic: Negative for environmental allergies.  Neurological:  Negative for dizziness and headaches.  Psychiatric/Behavioral:  The patient is not nervous/anxious.          Past Medical History:  Diagnosis Date   Acute non-recurrent maxillary sinusitis 03/29/2023   Depression    Ectopic pregnancy    Ectopic pregnancy, tubal 10/31/2010   Epistaxis 05/20/2020   Hypertension    Migraines    Ovarian cyst    PCOS (polycystic ovarian syndrome)     Social History   Socioeconomic History   Marital status: Single    Spouse name: Not on file   Number of children: Not on file   Years of education: Not on file   Highest education level: Some college, no degree  Occupational History   Not on file  Tobacco Use   Smoking status: Never   Smokeless tobacco: Never  Vaping Use   Vaping status: Never Used  Substance and Sexual Activity   Alcohol use: No   Drug use: No   Sexual activity: Yes  Other  Topics Concern   Not on file  Social History Narrative   Moved from [place], from [place].   Works with [employer].   Single.   1 child.    Social Drivers of Corporate investment banker Strain: Low Risk  (07/09/2023)   Overall Financial Resource Strain (CARDIA)    Difficulty of Paying Living Expenses: Not hard at all  Food Insecurity: No Food Insecurity (07/09/2023)   Hunger Vital Sign    Worried About Running Out of Food in the Last Year: Never true    Ran Out of Food in the Last Year: Never true  Transportation Needs: No Transportation Needs (07/09/2023)   PRAPARE - Administrator, Civil Service (Medical): No    Lack of Transportation (Non-Medical): No  Physical Activity: Insufficiently Active (07/09/2023)   Exercise Vital Sign    Days of Exercise per Week: 3 days    Minutes of Exercise per Session: 30 min  Stress: Stress Concern Present (07/09/2023)   [employer] of Occupational Health - Occupational Stress Questionnaire    Feeling of Stress : To some extent  Social Connections: Socially Isolated (07/09/2023)   Social Connection and Isolation Panel [NHANES]    Frequency of Communication with Friends and Family: Once a week    Frequency of Social Gatherings with Friends and Family: Once a week    Attends Religious Services: 1 to 4 times per  year    Active Member of Clubs or Organizations: No    Attends Banker Meetings: Not on file    Marital Status: Never married  Intimate Partner Violence: Unknown (09/12/2021)   Received from [facility]   HITS    Physically Hurt: Not on file    Insult or Talk Down To: Not on file    Threaten Physical Harm: Not on file    Scream or Curse: Not on file    Past Surgical History:  Procedure Laterality Date   ABDOMINAL HYSTERECTOMY  2018   one ovary remaining   WISDOM TOOTH EXTRACTION      Family History  Problem Relation Age of Onset   Hypertension Mother    Arthritis Mother     Hypercholesterolemia Mother    Hypertension Father    Arthritis Father    Hypercholesterolemia Father    Arthritis Maternal Grandmother    Bone cancer Paternal Grandmother     No Known Allergies  Current Outpatient Medications on File Prior to Visit  Medication Sig Dispense Refill   amLODipine (NORVASC) 2.5 MG tablet Take 1 tablet (2.5 mg total) by mouth daily. for blood pressure. 90 tablet 0   hydrochlorothiazide (HYDRODIURIL) 25 MG tablet TAKE 1 TABLET DAILY FOR BLOOD PRESSURE 90 tablet 2   sertraline (ZOLOFT) 100 MG tablet TAKE 1 TABLET DAILY FOR ANXIETY AND DEPRESSION 90 tablet 3   topiramate (TOPAMAX) 100 MG tablet Take 1 tablet (100 mg total) by mouth at bedtime. For headache prevention 90 tablet 0   topiramate (TOPAMAX) 50 MG tablet Take 50 mg by mouth daily.     No current facility-administered medications on file prior to visit.    BP 124/72   Pulse 100   Temp 98.1 F (36.7 C) (Temporal)   Ht 5\' 2"  (1.575 m)   Wt 155 lb (70.3 kg)   LMP 12/18/2013   SpO2 98%   BMI 28.35 kg/m  Objective:   Physical Exam HENT:     Right Ear: Tympanic membrane and ear canal normal.     Left Ear: Tympanic membrane and ear canal normal.  Eyes:     Pupils: Pupils are equal, round, and reactive to light.  Cardiovascular:     Rate and Rhythm: Normal rate and regular rhythm.  Pulmonary:     Effort: Pulmonary effort is normal.     Breath sounds: Normal breath sounds.  Abdominal:     General: Bowel sounds are normal.     Palpations: Abdomen is soft.     Tenderness: There is no abdominal tenderness.  Musculoskeletal:        General: Normal range of motion.     Cervical back: Neck supple.  Skin:    General: Skin is warm and dry.  Neurological:     Mental Status: She is alert and oriented to person, place, and time.     Cranial Nerves: No cranial nerve deficit.     Deep Tendon Reflexes:     Reflex Scores:      Patellar reflexes are 2+ on the right side and 2+ on the left  side. Psychiatric:        Mood and Affect: Mood normal.           Assessment & Plan:  Preventative health care Assessment & Plan: Immunizations UTD. Pap smear UTD. Follows with GYN  Discussed the importance of a healthy diet and regular exercise in order for weight loss, and to reduce the  risk of further co-morbidity.  Exam stable. Labs pending.  Follow up in 1 year for repeat physical.    Essential hypertension Assessment & Plan: Controlled.  Continue hydrochlorothiazide 25 mg daily and amlodipine 2.5 mg daily. BMP reviewed from recent ED visit.   Chronic migraine without aura without status migrainosus, not intractable Assessment & Plan: Controlled overall.  Continue Topamax 150 mg daily for headache prevention. Continue sumatriptan 100 mg PRN.  Orders: -     SUMAtriptan Succinate; Take 1 tablet by mouth at migraine onset. Take 1 tablet 2 hours later if migraine persists.  Dispense: 10 tablet; Refill: 0  Adjustment disorder with mixed anxiety and depressed mood Assessment & Plan: Controlled.  Continue sertraline 100 mg daily.         Doreene Nest, NP

## 2023-07-13 NOTE — Assessment & Plan Note (Signed)
 Controlled.  Continue sertraline 100 mg daily.

## 2023-07-13 NOTE — Assessment & Plan Note (Signed)
 Controlled.  Continue hydrochlorothiazide 25 mg daily and amlodipine 2.5 mg daily. BMP reviewed from recent ED visit.

## 2023-08-11 ENCOUNTER — Ambulatory Visit

## 2023-08-11 ENCOUNTER — Encounter: Payer: Self-pay | Admitting: *Deleted

## 2023-08-11 ENCOUNTER — Ambulatory Visit
Admission: EM | Admit: 2023-08-11 | Discharge: 2023-08-11 | Disposition: A | Discharge status: Home / Self Care | Attending: Emergency Medicine | Admitting: Emergency Medicine

## 2023-08-11 ENCOUNTER — Other Ambulatory Visit: Payer: Self-pay | Admitting: Primary Care

## 2023-08-11 DIAGNOSIS — J029 Acute pharyngitis, unspecified: Secondary | ICD-10-CM | POA: Diagnosis present

## 2023-08-11 DIAGNOSIS — G43709 Chronic migraine without aura, not intractable, without status migrainosus: Secondary | ICD-10-CM

## 2023-08-11 LAB — POCT RAPID STREP A (OFFICE): Rapid Strep A Screen: NEGATIVE

## 2023-08-11 MED ORDER — PREDNISONE 20 MG PO TABS: 40.0000 mg | ORAL_TABLET | Freq: Every day | ORAL | 0 refills | Status: DC

## 2023-08-11 NOTE — Discharge Instructions (Signed)
 Your rapid strep test today was negative, throat swab has been sent to the lab to see if bacteria will grow, you will be notified if this occurs and antibiotic started  For pain begin prednisone  every morning with food for 5 days to reduce inflammation, may use Tylenol  while taking this medicine  May use of salt gargles throat lozenges, warm liquids, teaspoons of honey and over-the-counter clippers septic spray for comfort  May give Tylenol  every 6 hours as needed for additional comfort  You may follow-up at urgent care as needed

## 2023-08-11 NOTE — ED Triage Notes (Signed)
 Patient states sore throat since yesterday, no other symptoms.  Would like strep test

## 2023-08-11 NOTE — ED Provider Notes (Signed)
 Arlander Bellman    CSN: [id number] Arrival date & time: 08/11/23  0804      History   Chief Complaint Chief Complaint  Patient presents with   Sore Throat    HPI April Bowen is a 38 y.o. female.   Patient presents for evaluation of a sore throat beginning 1 day ago, has begun to experience bilateral ear pain/fullness while in clinic today.  Rating throat pain a 10 out of 10.  No known sick contacts.  Tolerating food and liquids.  Has attempted use of salt water gargle and hot tea.  Denies fever, chest congestion or cough.  Past Medical History:  Diagnosis Date   Acute non-recurrent maxillary sinusitis 03/29/2023   Depression    Ectopic pregnancy    Ectopic pregnancy, tubal 10/31/2010   Epistaxis 05/20/2020   Hypertension    Migraines    Ovarian cyst    PCOS (polycystic ovarian syndrome)     Patient Active Problem List   Diagnosis Date Noted   Fluttering sensation of heart 07/21/2021   Preventative health care 05/19/2019   Adjustment disorder with mixed anxiety and depressed mood 06/14/2018   Essential hypertension 05/20/2018   Migraines 05/20/2018    Past Surgical History:  Procedure Laterality Date   ABDOMINAL HYSTERECTOMY  2018   one ovary remaining   WISDOM TOOTH EXTRACTION      OB History     Gravida  1   Para      Term      Preterm      AB  1   Living         SAB      IAB      Ectopic  1   Multiple      Live Births               Home Medications    Prior to Admission medications   Medication Sig Start Date End Date Taking? Authorizing Provider  loratadine (CLARITIN) 10 MG tablet Take 10 mg by mouth daily.   Yes [provider]  predniSONE  (DELTASONE ) 20 MG tablet Take 2 tablets (40 mg total) by mouth daily. 08/11/23  Yes Latangela Mccomas R, NP  amLODipine  (NORVASC ) 2.5 MG tablet Take 1 tablet (2.5 mg total) by mouth daily. for blood pressure. 05/15/23   Clark, Katherine K, NP  hydrochlorothiazide   (HYDRODIURIL ) 25 MG tablet TAKE 1 TABLET DAILY FOR BLOOD PRESSURE 09/07/22   Clark, Katherine K, NP  sertraline  (ZOLOFT ) 100 MG tablet TAKE 1 TABLET DAILY FOR ANXIETY AND DEPRESSION 07/13/22   Clark, Katherine K, NP  SUMAtriptan  (IMITREX ) 100 MG tablet Take 1 tablet by mouth at migraine onset. Take 1 tablet 2 hours later if migraine persists. 07/13/23   Clark, Katherine K, NP  topiramate  (TOPAMAX ) 100 MG tablet Take 1 tablet (100 mg total) by mouth at bedtime. For headache prevention 05/15/23   Clark, Katherine K, NP  topiramate  (TOPAMAX ) 50 MG tablet Take 50 mg by mouth daily.    [provider]    Family History Family History  Problem Relation Age of Onset   Hypertension Mother    Arthritis Mother    Hypercholesterolemia Mother    Hypertension Father    Arthritis Father    Hypercholesterolemia Father    Arthritis Maternal Grandmother    Bone cancer Paternal Grandmother     Social History Social History   Tobacco Use   Smoking status: Never   Smokeless tobacco: Never  Vaping  Use   Vaping status: Never Used  Substance Use Topics   Alcohol use: No   Drug use: No     Allergies   Patient has no known allergies.   Review of Systems Review of Systems   Physical Exam Triage Vital Signs ED Triage Vitals  Encounter Vitals Group     BP 08/11/23 0817 126/87     Systolic BP Percentile --      Diastolic BP Percentile --      Pulse Rate 08/11/23 0817 89     Resp 08/11/23 0817 18     Temp 08/11/23 0817 98.9 F (37.2 C)     Temp Source 08/11/23 0817 Oral     SpO2 08/11/23 0817 97 %     Weight 08/11/23 0815 153 lb (69.4 kg)     Height 08/11/23 0815 5\' 2"  (1.575 m)     Head Circumference --      Peak Flow --      Pain Score 08/11/23 0815 7     Pain Loc --      Pain Education --      Exclude from Growth Chart --    No data found.  Updated Vital Signs BP 126/87 (BP Location: Left Arm)   Pulse 89   Temp 98.9 F (37.2 C) (Oral)   Resp 18   Ht 5\' 2"  (1.575 m)    Wt 153 lb (69.4 kg)   LMP 12/18/2013   SpO2 97%   BMI 27.98 kg/m   Visual Acuity Right Eye Distance:   Left Eye Distance:   Bilateral Distance:    Right Eye Near:   Left Eye Near:    Bilateral Near:     Physical Exam Constitutional:      Appearance: Normal appearance. She is well-developed.  HENT:     Right Ear: Tympanic membrane, ear canal and external ear normal.     Left Ear: Tympanic membrane, ear canal and external ear normal.     Nose: Nose normal.     Mouth/Throat:     Pharynx: Posterior oropharyngeal erythema present.     Tonsils: No tonsillar exudate. 0 on the right. 0 on the left.  Eyes:     Extraocular Movements: Extraocular movements intact.  Pulmonary:     Effort: Pulmonary effort is normal.  Musculoskeletal:     Cervical back: Normal range of motion and neck supple.  Neurological:     Mental Status: She is alert and oriented to person, place, and time. Mental status is at baseline.      UC Treatments / Results  Labs (all labs ordered are listed, but only abnormal results are displayed) Labs Reviewed  CULTURE, GROUP A STREP [facility])  POCT RAPID STREP A (OFFICE)    EKG   Radiology No results found.  Procedures Procedures (including critical care time)  Medications Ordered in UC Medications - No data to display  Initial Impression / Assessment and Plan / UC Course  I have reviewed the triage vital signs and the nursing notes.  Pertinent labs & imaging results that were available during my care of the patient were reviewed by me and considered in my medical decision making (see chart for details).  Sore throat  Patient is in no signs of distress nor toxic appearing.  Vital signs are stable.  Low suspicion for pneumonia, pneumothorax or bronchitis and therefore will defer imaging.  Rapid strep test negative, sent for culture.  Prescribed prednisone . May use additional over-the-counter medications  as needed for supportive care.  May follow-up  with urgent care as needed if symptoms persist or worsen.   Final Clinical Impressions(s) / UC Diagnoses   Final diagnoses:  Sore throat   Discharge Instructions      Your rapid strep test today was negative, throat swab has been sent to the lab to see if bacteria will grow, you will be notified if this occurs and antibiotic started  For pain begin prednisone  every morning with food for 5 days to reduce inflammation, may use Tylenol  while taking this medicine  May use of salt gargles throat lozenges, warm liquids, teaspoons of honey and over-the-counter clippers septic spray for comfort  May give Tylenol  every 6 hours as needed for additional comfort  You may follow-up at urgent care as needed     ED Prescriptions     Medication Sig Dispense Auth. Provider   predniSONE  (DELTASONE ) 20 MG tablet Take 2 tablets (40 mg total) by mouth daily. 10 tablet Janeva Peaster R, NP      PDMP not reviewed this encounter.   Reena Canning, [place] 08/11/23 [phone]

## 2023-08-14 ENCOUNTER — Ambulatory Visit: Payer: Self-pay

## 2023-08-14 LAB — CULTURE, GROUP A STREP (THRC)

## 2023-09-21 ENCOUNTER — Ambulatory Visit (INDEPENDENT_AMBULATORY_CARE_PROVIDER_SITE_OTHER)
Admission: RE | Admit: 2023-09-21 | Discharge: 2023-09-21 | Disposition: A | Discharge status: Home / Self Care | Source: Ambulatory Visit | Attending: Primary Care | Admitting: Primary Care

## 2023-09-21 ENCOUNTER — Encounter: Payer: Self-pay | Admitting: Primary Care

## 2023-09-21 ENCOUNTER — Ambulatory Visit: Admitting: Primary Care

## 2023-09-21 VITALS — BP 148/86 | HR 111 | Temp 97.8°F | Ht 62.0 in | Wt 159.0 lb

## 2023-09-21 DIAGNOSIS — G8929 Other chronic pain: Secondary | ICD-10-CM

## 2023-09-21 DIAGNOSIS — M25562 Pain in left knee: Secondary | ICD-10-CM

## 2023-09-21 MED ORDER — MELOXICAM 15 MG PO TABS: 15.0000 mg | ORAL_TABLET | Freq: Every day | ORAL | 0 refills | Status: AC | PRN

## 2023-09-21 NOTE — Patient Instructions (Signed)
 Complete xray(s) prior to leaving today. I will notify you of your results once received.  You may take meloxicam once daily with food as needed for pain and inflammation.  It was a pleasure to see you today!

## 2023-09-21 NOTE — Assessment & Plan Note (Signed)
 Differentials include bursitis, tendinitis, tendon tear. Less suspicious for tendon tear given exam and lack of recent trauma.  Will obtain x-rays of the left knee today. Start meloxicam 15 mg daily as needed with food.  We discussed potential next steps of sports medicine evaluation, MRI, physical therapy. Await x-ray results.

## 2023-09-21 NOTE — Progress Notes (Signed)
 Subjective:    Patient ID: April Bowen, female    DOB: [birth year], 38 y.o.   MRN: [id number]  Knee Pain     April Bowen is a very pleasant 38 y.o. female with a hypertension, migraines, anxiety depression who presents today to discuss knee pain  Her pain is located to the left anterior knee which began about 12 months ago. Over the last 2-3 months she's noticed increased pain with weakness.   Her pain is intermittent, worse with prolonged walking, doing squats during a work out, lifting with her knees. She has been taking Tylenol  without improvement.   She is following with sports medicine for chronic shoulder pain, has been receiving cortisone shots, is pending surgery for July.   She denies injury/trauma, erythema, joint swelling.   BP Readings from Last 3 Encounters:  09/21/23 (!) 148/86  08/11/23 126/87  07/13/23 124/72     Review of Systems  Musculoskeletal:  Positive for arthralgias. Negative for joint swelling.  Skin:  Negative for color change.  Neurological:  Positive for weakness.         Past Medical History:  Diagnosis Date   Acute non-recurrent maxillary sinusitis 03/29/2023   Depression    Ectopic pregnancy    Ectopic pregnancy, tubal 10/31/2010   Epistaxis 05/20/2020   Hypertension    Migraines    Ovarian cyst    PCOS (polycystic ovarian syndrome)     Social History   Socioeconomic History   Marital status: Single    Spouse name: Not on file   Number of children: Not on file   Years of education: Not on file   Highest education level: Some college, no degree  Occupational History   Not on file  Tobacco Use   Smoking status: Never   Smokeless tobacco: Never  Vaping Use   Vaping status: Never Used  Substance and Sexual Activity   Alcohol use: No   Drug use: No   Sexual activity: Yes  Other Topics Concern   Not on file  Social History Narrative   Moved from [place], from [place].   Works with [employer].   Single.    1 child.    Social Drivers of Corporate investment banker Strain: Low Risk  (07/09/2023)   Overall Financial Resource Strain (CARDIA)    Difficulty of Paying Living Expenses: Not hard at all  Food Insecurity: No Food Insecurity (07/09/2023)   Hunger Vital Sign    Worried About Running Out of Food in the Last Year: Never true    Ran Out of Food in the Last Year: Never true  Transportation Needs: No Transportation Needs (07/09/2023)   PRAPARE - Administrator, Civil Service (Medical): No    Lack of Transportation (Non-Medical): No  Physical Activity: Insufficiently Active (07/09/2023)   Exercise Vital Sign    Days of Exercise per Week: 3 days    Minutes of Exercise per Session: 30 min  Stress: Stress Concern Present (07/09/2023)   [employer] of Occupational Health - Occupational Stress Questionnaire    Feeling of Stress : To some extent  Social Connections: Socially Isolated (07/09/2023)   Social Connection and Isolation Panel    Frequency of Communication with Friends and Family: Once a week    Frequency of Social Gatherings with Friends and Family: Once a week    Attends Religious Services: 1 to 4 times per year    Active Member of Clubs or Organizations: No  Attends Banker Meetings: Not on file    Marital Status: Never married  Intimate Partner Violence: Unknown (09/12/2021)   Received from [facility]   HITS    Physically Hurt: Not on file    Insult or Talk Down To: Not on file    Threaten Physical Harm: Not on file    Scream or Curse: Not on file    Past Surgical History:  Procedure Laterality Date   ABDOMINAL HYSTERECTOMY  2018   one ovary remaining   WISDOM TOOTH EXTRACTION      Family History  Problem Relation Age of Onset   Hypertension Mother    Arthritis Mother    Hypercholesterolemia Mother    Hypertension Father    Arthritis Father    Hypercholesterolemia Father    Arthritis Maternal Grandmother    Bone cancer Paternal  Grandmother     No Known Allergies  Current Outpatient Medications on File Prior to Visit  Medication Sig Dispense Refill   amLODipine  (NORVASC ) 2.5 MG tablet Take 1 tablet (2.5 mg total) by mouth daily. for blood pressure. 90 tablet 0   hydrochlorothiazide  (HYDRODIURIL ) 25 MG tablet TAKE 1 TABLET DAILY FOR BLOOD PRESSURE 90 tablet 2   loratadine (CLARITIN) 10 MG tablet Take 10 mg by mouth daily.     sertraline  (ZOLOFT ) 100 MG tablet TAKE 1 TABLET DAILY FOR ANXIETY AND DEPRESSION 90 tablet 3   SUMAtriptan  (IMITREX ) 100 MG tablet Take 1 tablet by mouth at migraine onset. Take 1 tablet 2 hours later if migraine persists. 10 tablet 0   topiramate  (TOPAMAX ) 100 MG tablet Take 1 tablet (100 mg total) by mouth at bedtime. For headache prevention 90 tablet 0   topiramate  (TOPAMAX ) 50 MG tablet Take 50 mg by mouth daily.     No current facility-administered medications on file prior to visit.    BP (!) 148/86   Pulse (!) 111   Temp 97.8 F (36.6 C) (Temporal)   Ht 5' 2 (1.575 m)   Wt 159 lb (72.1 kg)   LMP 12/18/2013   SpO2 98%   BMI 29.08 kg/m  Objective:   Physical Exam  Cardiovascular:     Rate and Rhythm: Normal rate.  Pulmonary:     Effort: Pulmonary effort is normal.   Musculoskeletal:     Right knee: No swelling or erythema. Normal range of motion.     Left knee: No swelling or erythema. Normal range of motion.   Skin:    General: Skin is warm and dry.     Findings: No erythema.           Assessment & Plan:  Chronic pain of left knee Assessment & Plan: Differentials include bursitis, tendinitis, tendon tear. Less suspicious for tendon tear given exam and lack of recent trauma.  Will obtain x-rays of the left knee today. Start meloxicam 15 mg daily as needed with food.  We discussed potential next steps of sports medicine evaluation, MRI, physical therapy. Await x-ray results.  Orders: -     Meloxicam; Take 1 tablet (15 mg total) by mouth daily as needed  for pain.  Dispense: 30 tablet; Refill: 0 -     DG Knee 4 Views W/Patella Left        Gabriel John, NP

## 2023-09-24 ENCOUNTER — Ambulatory Visit: Payer: Self-pay | Admitting: Primary Care

## 2023-10-11 ENCOUNTER — Other Ambulatory Visit: Payer: Self-pay | Admitting: Obstetrics

## 2023-10-11 ENCOUNTER — Encounter: Payer: Self-pay | Admitting: Obstetrics

## 2023-10-11 DIAGNOSIS — Z9189 Other specified personal risk factors, not elsewhere classified: Secondary | ICD-10-CM

## 2023-10-12 ENCOUNTER — Ambulatory Visit
Admission: RE | Admit: 2023-10-12 | Discharge: 2023-10-12 | Disposition: A | Discharge status: Home / Self Care | Source: Ambulatory Visit | Attending: Obstetrics | Admitting: Obstetrics

## 2023-10-12 DIAGNOSIS — Z9189 Other specified personal risk factors, not elsewhere classified: Secondary | ICD-10-CM

## 2023-11-02 DIAGNOSIS — I10 Essential (primary) hypertension: Secondary | ICD-10-CM

## 2023-11-02 MED ORDER — AMLODIPINE BESYLATE 2.5 MG PO TABS: 2.5000 mg | ORAL_TABLET | Freq: Every day | ORAL | 1 refills | Status: AC

## 2023-12-31 ENCOUNTER — Emergency Department (HOSPITAL_COMMUNITY)
Admission: EM | Admit: 2023-12-31 | Discharge: 2023-12-31 | Disposition: A | Discharge status: Home / Self Care | Attending: Emergency Medicine | Admitting: Emergency Medicine

## 2023-12-31 ENCOUNTER — Other Ambulatory Visit: Payer: Self-pay

## 2023-12-31 ENCOUNTER — Emergency Department (HOSPITAL_COMMUNITY)

## 2023-12-31 ENCOUNTER — Encounter (HOSPITAL_COMMUNITY): Payer: Self-pay

## 2023-12-31 DIAGNOSIS — M542 Cervicalgia: Secondary | ICD-10-CM | POA: Insufficient documentation

## 2023-12-31 DIAGNOSIS — Y92410 Unspecified street and highway as the place of occurrence of the external cause: Secondary | ICD-10-CM | POA: Insufficient documentation

## 2023-12-31 DIAGNOSIS — R519 Headache, unspecified: Secondary | ICD-10-CM | POA: Diagnosis not present

## 2023-12-31 DIAGNOSIS — Z79899 Other long term (current) drug therapy: Secondary | ICD-10-CM | POA: Diagnosis not present

## 2023-12-31 DIAGNOSIS — I10 Essential (primary) hypertension: Secondary | ICD-10-CM | POA: Insufficient documentation

## 2023-12-31 DIAGNOSIS — M25511 Pain in right shoulder: Secondary | ICD-10-CM | POA: Diagnosis not present

## 2023-12-31 MED ORDER — LIDOCAINE 5 % EX PTCH: 1.0000 | MEDICATED_PATCH | CUTANEOUS | 0 refills | Status: DC

## 2023-12-31 MED ORDER — SUMATRIPTAN SUCCINATE 50 MG PO TABS: 50.0000 mg | ORAL_TABLET | ORAL | 0 refills | Status: DC | PRN

## 2023-12-31 MED ORDER — SUMATRIPTAN SUCCINATE 50 MG PO TABS
50.0000 mg | ORAL_TABLET | Freq: Once | ORAL | Status: AC
  Administered 2023-12-31: 50 mg via ORAL
  Filled 2023-12-31: qty 1
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
## 2024-02-14 ENCOUNTER — Ambulatory Visit: Admitting: Primary Care

## 2024-02-14 ENCOUNTER — Encounter: Payer: Self-pay | Admitting: Primary Care

## 2024-02-14 VITALS — BP 134/86 | HR 96 | Temp 98.5°F | Ht 62.0 in | Wt 160.2 lb

## 2024-02-14 DIAGNOSIS — F4323 Adjustment disorder with mixed anxiety and depressed mood: Secondary | ICD-10-CM

## 2024-02-14 MED ORDER — FLUOXETINE HCL 20 MG PO TABS: 20.0000 mg | ORAL_TABLET | Freq: Every day | ORAL | 0 refills | Status: AC

## 2024-02-14 NOTE — Patient Instructions (Signed)
 Start fluoxetine (Prozac) tablets for anxiety and depression.  Take 1/2 tablet by mouth daily for 1 week, then increase to 1 full tablet daily thereafter.  Please have your HR department send paperwork.  Schedule a follow-up visit for 1 month  It was a pleasure to see you today!

## 2024-02-14 NOTE — Assessment & Plan Note (Signed)
 Deteriorated.   Start fluoxetine (Prozac) tablets for anxiety and depression.  Take 1/2 tablet by mouth daily for 1 week, then increase to 1 full tablet daily thereafter. She is familiar with instructions for initiation and potential side effects.  Referral placed for therapy.  Agree for continuous FMLA with start date of 02/13/2024 and return to work date of 03/31/2024.  She will contact her HR department.
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
## 2024-02-15 ENCOUNTER — Telehealth: Payer: Self-pay

## 2024-02-15 ENCOUNTER — Other Ambulatory Visit (HOSPITAL_COMMUNITY): Payer: Self-pay

## 2024-02-15 NOTE — Telephone Encounter (Signed)
 Pharmacy Patient Advocate Encounter  Received notification from [facility] that Prior Authorization for Fluoxetine 20 tabs has been APPROVED from 01/16/24 to 02/14/25. Ran test claim, Copay is $0.92. This test claim was processed through [facility]- copay amounts may vary at other pharmacies due to pharmacy/plan contracts, or as the patient moves through the different stages of their insurance plan.   PA #/Case ID/Reference #: # [id number]

## 2024-02-15 NOTE — Telephone Encounter (Signed)
 Pharmacy Patient Advocate Encounter   Received notification from Onbase that prior authorization for Fluoxetine Hcl 20 tabs is required/requested.   Insurance verification completed.   The patient is insured through [employer].   Per test claim: PA required; PA submitted to above mentioned insurance via Latent Key/confirmation #/EOC [id number] Status is pending   Patients insurance prefers Fluoxetine Hcl 20 capsules

## 2024-02-19 NOTE — Telephone Encounter (Signed)
 Spoke with patient, she is going to reach out to employer regarding FMLA updates.  Will reach out to provider via MyChart with any changes that need to be made.

## 2024-02-20 NOTE — Telephone Encounter (Signed)
 Received FMLA and Disability forms for provider to complete.  Placed in providers box for review.

## 2024-02-20 NOTE — Telephone Encounter (Signed)
 Completed and placed on Erin's desk.

## 2024-02-21 NOTE — Telephone Encounter (Signed)
 Completed forms received and faxed to [facility] at [phone]   Copy sent to scan  Will mail copy for patients records to address on file as requested by pt.

## 2024-03-05 ENCOUNTER — Telehealth: Payer: Self-pay | Admitting: Primary Care

## 2024-03-05 DIAGNOSIS — Z0279 Encounter for issue of other medical certificate: Secondary | ICD-10-CM

## 2024-03-05 NOTE — Telephone Encounter (Signed)
 Disability attending physician statement received.   Will fax to [employer] at [phone] when completed  Forms placed in providers box for review.

## 2024-03-05 NOTE — Telephone Encounter (Signed)
 Completed and placed on Erin's desk.

## 2024-03-05 NOTE — Telephone Encounter (Signed)
 Received Disability ppwk from [place] [employer] placed in specialist box at front desk

## 2024-03-06 NOTE — Telephone Encounter (Signed)
 Completed forms received and faxed to [facility] at [phone]  Copy sent to scan  Copy left at front desk for patient's records, MyChart message sent letting patient know.

## 2024-03-17 MED ORDER — SUMATRIPTAN SUCCINATE 100 MG PO TABS: ORAL_TABLET | ORAL | 0 refills | Status: AC

## 2024-03-21 ENCOUNTER — Ambulatory Visit
Admission: EM | Admit: 2024-03-21 | Discharge: 2024-03-21 | Disposition: A | Discharge status: Home / Self Care | Attending: Student | Admitting: Student

## 2024-03-21 ENCOUNTER — Ambulatory Visit

## 2024-03-21 ENCOUNTER — Encounter: Payer: Self-pay | Admitting: Emergency Medicine

## 2024-03-21 DIAGNOSIS — J069 Acute upper respiratory infection, unspecified: Secondary | ICD-10-CM | POA: Diagnosis present

## 2024-03-21 LAB — POCT RAPID STREP A (OFFICE): Rapid Strep A Screen: NEGATIVE

## 2024-03-21 LAB — POC COVID19/FLU A&B COMBO
Covid Antigen, POC: NEGATIVE
Influenza A Antigen, POC: NEGATIVE
Influenza B Antigen, POC: NEGATIVE

## 2024-03-21 MED ORDER — LIDOCAINE VISCOUS HCL 2 % MT SOLN: 15.0000 mL | OROMUCOSAL | 0 refills | Status: AC | PRN

## 2024-03-21 NOTE — Discharge Instructions (Signed)
-  Your COVID and influenza and strep tests were negative. We are sending a strep culture, which will result in about 3 days.  -You have a virus, like the common cold.  Viruses typically last 5 to 7 days.  After 7 days, your symptoms should be improving rather than worsening.  If your symptoms improve, and then worsen again, this is when we worry about a sinus infection or a lung infection, and you should return for additional care. -For sore throat, use lidocaine  mouthwash up to every 4 hours. Make sure not to eat for at least 1 hour after using this, as your mouth will be very numb and you could bite yourself. -For fevers/chills and bodyaches, You can take Tylenol  up to 1000 mg 3 times daily, and ibuprofen  up to 600 mg 3 times daily with food.  You can take these together, or alternate every 3-4 hours. -Get lots of rest and drink plenty of fluids!

## 2024-03-21 NOTE — ED Provider Notes (Signed)
 [facility]    CSN: [id number] Arrival date & time: 03/21/24  9075      History   Chief Complaint Chief Complaint  Patient presents with   Sore Throat    HPI April Bowen is a 38 y.o. female presenting with sore throat. Pt c/o sore throat, fever, body aches for 1 day.  Temperature was 99-100 at home. Denies n/v/d/c/abd pain Has not attempted interventions at home.  Denies h/o pulm ds  HPI  Past Medical History:  Diagnosis Date   Acute non-recurrent maxillary sinusitis 03/29/2023   Depression    Ectopic pregnancy    Ectopic pregnancy, tubal 10/31/2010   Epistaxis 05/20/2020   Hypertension    Migraines    Ovarian cyst    PCOS (polycystic ovarian syndrome)     Patient Active Problem List   Diagnosis Date Noted   Chronic pain of left knee 09/21/2023   Fluttering sensation of heart 07/21/2021   Preventative health care 05/19/2019   Adjustment disorder with mixed anxiety and depressed mood 06/14/2018   Essential hypertension 05/20/2018   Migraines 05/20/2018    Past Surgical History:  Procedure Laterality Date   ABDOMINAL HYSTERECTOMY  2018   one ovary remaining   WISDOM TOOTH EXTRACTION      OB History     Gravida  1   Para      Term      Preterm      AB  1   Living         SAB      IAB      Ectopic  1   Multiple      Live Births               Home Medications    Prior to Admission medications  Medication Sig Start Date End Date Taking? Authorizing Provider  lidocaine  (XYLOCAINE ) 2 % solution Use as directed 15 mLs in the mouth or throat every 4 (four) hours as needed for mouth pain. Swish/gargle and spit 03/21/24  Yes Whittaker Lenis E, PA-C  amLODipine  (NORVASC ) 2.5 MG tablet Take 1 tablet (2.5 mg total) by mouth daily. for blood pressure. 11/02/23   Clark, Katherine K, NP  FLUoxetine  (PROZAC ) 20 MG tablet Take 1 tablet (20 mg total) by mouth daily. for anxiety and depression. 02/14/24   Clark, Katherine K, NP   hydrochlorothiazide  (HYDRODIURIL ) 25 MG tablet TAKE 1 TABLET DAILY FOR BLOOD PRESSURE 09/07/22   Clark, Katherine K, NP  loratadine (CLARITIN) 10 MG tablet Take 10 mg by mouth daily.    [provider]  meloxicam  (MOBIC ) 15 MG tablet Take 1 tablet (15 mg total) by mouth daily as needed for pain. 09/21/23   Clark, Katherine K, NP  SUMAtriptan  (IMITREX ) 100 MG tablet Take 1 tablet by mouth at migraine onset. Take 1 tablet 2 hours later if migraine persists. 03/17/24   Clark, Katherine K, NP  topiramate  (TOPAMAX ) 100 MG tablet Take 1 tablet (100 mg total) by mouth at bedtime. For headache prevention 05/15/23   Clark, Katherine K, NP  topiramate  (TOPAMAX ) 50 MG tablet Take 50 mg by mouth daily.    [provider]    Family History Family History  Problem Relation Age of Onset   Hypertension Mother    Arthritis Mother    Hypercholesterolemia Mother    Hypertension Father    Arthritis Father    Hypercholesterolemia Father    Arthritis Maternal Grandmother    Bone cancer  Paternal Grandmother     Social History Social History[1]   Allergies   Patient has no known allergies.   Review of Systems Review of Systems  Constitutional:  Positive for fever. Negative for appetite change and chills.  HENT:  Positive for sore throat. Negative for congestion, ear pain, rhinorrhea, sinus pressure and sinus pain.   Eyes:  Negative for redness and visual disturbance.  Respiratory:  Negative for cough, chest tightness, shortness of breath and wheezing.   Cardiovascular:  Negative for chest pain and palpitations.  Gastrointestinal:  Negative for abdominal pain, constipation, diarrhea, nausea and vomiting.  Genitourinary:  Negative for dysuria, frequency and urgency.  Musculoskeletal:  Positive for myalgias.  Neurological:  Negative for dizziness, weakness and headaches.  Psychiatric/Behavioral:  Negative for confusion.   All other systems reviewed and are negative.    Physical  Exam Triage Vital Signs ED Triage Vitals  Encounter Vitals Group     BP      Girls Systolic BP Percentile      Girls Diastolic BP Percentile      Boys Systolic BP Percentile      Boys Diastolic BP Percentile      Pulse      Resp      Temp      Temp src      SpO2      Weight      Height      Head Circumference      Peak Flow      Pain Score      Pain Loc      Pain Education      Exclude from Growth Chart    No data found.  Updated Vital Signs BP 135/89 (BP Location: Left Arm)   Pulse (!) 112   Temp 98.2 F (36.8 C) (Oral)   Resp 17   LMP 12/18/2013   SpO2 96%   Visual Acuity Right Eye Distance:   Left Eye Distance:   Bilateral Distance:    Right Eye Near:   Left Eye Near:    Bilateral Near:     Physical Exam Vitals reviewed.  Constitutional:      General: She is not in acute distress.    Appearance: Normal appearance. She is not ill-appearing.  HENT:     Head: Normocephalic and atraumatic.     Right Ear: Tympanic membrane, ear canal and external ear normal. No tenderness. No middle ear effusion. There is no impacted cerumen. Tympanic membrane is not perforated, erythematous, retracted or bulging.     Left Ear: Tympanic membrane, ear canal and external ear normal. No tenderness.  No middle ear effusion. There is no impacted cerumen. Tympanic membrane is not perforated, erythematous, retracted or bulging.     Nose: Nose normal. No congestion.     Mouth/Throat:     Mouth: Mucous membranes are moist.     Pharynx: Uvula midline. No oropharyngeal exudate or posterior oropharyngeal erythema.     Tonsils: No tonsillar exudate.  Eyes:     Extraocular Movements: Extraocular movements intact.     Pupils: Pupils are equal, round, and reactive to light.  Cardiovascular:     Rate and Rhythm: Regular rhythm. Tachycardia present.     Heart sounds: Normal heart sounds.  Pulmonary:     Effort: Pulmonary effort is normal.     Breath sounds: Normal breath sounds. No  decreased breath sounds, wheezing, rhonchi or rales.  Abdominal:     Palpations: Abdomen is soft.  Tenderness: There is no abdominal tenderness. There is no guarding or rebound.  Lymphadenopathy:     Cervical: No cervical adenopathy.     Right cervical: No superficial, deep or posterior cervical adenopathy.    Left cervical: No superficial, deep or posterior cervical adenopathy.  Skin:    Comments: No rash   Neurological:     General: No focal deficit present.     Mental Status: She is alert and oriented to person, place, and time.  Psychiatric:        Mood and Affect: Mood normal.        Behavior: Behavior normal.        Thought Content: Thought content normal.        Judgment: Judgment normal.      UC Treatments / Results  Labs (all labs ordered are listed, but only abnormal results are displayed) Labs Reviewed  POCT RAPID STREP A (OFFICE) - Normal  POC COVID19/FLU A&B COMBO - Normal  CULTURE, GROUP A STREP [facility])    EKG   Radiology No results found.  Procedures Procedures (including critical care time)  Medications Ordered in UC Medications - No data to display  Initial Impression / Assessment and Plan / UC Course  I have reviewed the triage vital signs and the nursing notes.  Pertinent labs & imaging results that were available during my care of the patient were reviewed by me and considered in my medical decision making (see chart for details).     Patient is a pleasant 38 y.o. female presenting with viral URI. The patient is afebrile but is tachycardic at 112.  Antipyretic has not been administered today.  -Strep negative culture sent -Covid negative -Influenza negative  Viscous lidocaine  sent for symptomatic relief.  She will start Tylenol  and/or ibuprofen  for fever reduction and myalgias.  Return precautions as below.  Level 4 for acute illness with systemic symptoms and prescription drug management.  Final Clinical Impressions(s) / UC Diagnoses    Final diagnoses:  Viral URI     Discharge Instructions      -Your COVID and influenza and strep tests were negative. We are sending a strep culture, which will result in about 3 days.  -You have a virus, like the common cold.  Viruses typically last 5 to 7 days.  After 7 days, your symptoms should be improving rather than worsening.  If your symptoms improve, and then worsen again, this is when we worry about a sinus infection or a lung infection, and you should return for additional care. -For sore throat, use lidocaine  mouthwash up to every 4 hours. Make sure not to eat for at least 1 hour after using this, as your mouth will be very numb and you could bite yourself. -For fevers/chills and bodyaches, You can take Tylenol  up to 1000 mg 3 times daily, and ibuprofen  up to 600 mg 3 times daily with food.  You can take these together, or alternate every 3-4 hours. -Get lots of rest and drink plenty of fluids!     ED Prescriptions     Medication Sig Dispense Auth. Provider   lidocaine  (XYLOCAINE ) 2 % solution Use as directed 15 mLs in the mouth or throat every 4 (four) hours as needed for mouth pain. Swish/gargle and spit 100 mL Alister Staver E, PA-C      PDMP not reviewed this encounter.       [1]  Social History Tobacco Use   Smoking status: Never   Smokeless tobacco:  Never  Vaping Use   Vaping status: Never Used  Substance Use Topics   Alcohol use: No   Drug use: No     Arlyss Leita BRAVO, PA-C 03/21/24 1006  "

## 2024-03-21 NOTE — ED Triage Notes (Signed)
 Pt c/o sore throat, fever, body aches for 1 day.

## 2024-03-24 LAB — CULTURE, GROUP A STREP (THRC)

## 2024-03-25 ENCOUNTER — Ambulatory Visit (HOSPITAL_COMMUNITY): Payer: Self-pay
